# Patient Record
Sex: Female | Born: 1960 | Race: White | Hispanic: No | Marital: Single | Smoking: Never smoker
Health system: Southern US, Community
[De-identification: ages and names within clinical notes are randomized; demographics above are authoritative.]

## PROBLEM LIST (undated history)

## (undated) DIAGNOSIS — I4891 Unspecified atrial fibrillation: Secondary | ICD-10-CM

## (undated) DIAGNOSIS — F419 Anxiety disorder, unspecified: Secondary | ICD-10-CM

## (undated) HISTORY — DX: Unspecified atrial fibrillation: I48.91

## (undated) HISTORY — DX: Anxiety disorder, unspecified: F41.9

## (undated) HISTORY — PX: CHOLECYSTECTOMY: SHX55

## (undated) HISTORY — PX: ABDOMINAL HYSTERECTOMY: SHX81

## (undated) HISTORY — PX: TUBAL LIGATION: SHX77

## (undated) HISTORY — PX: FEMUR SURGERY: SHX943

---

## 2006-04-14 DIAGNOSIS — G43909 Migraine, unspecified, not intractable, without status migrainosus: Secondary | ICD-10-CM | POA: Insufficient documentation

## 2006-04-14 DIAGNOSIS — R002 Palpitations: Secondary | ICD-10-CM | POA: Insufficient documentation

## 2006-04-14 DIAGNOSIS — M5127 Other intervertebral disc displacement, lumbosacral region: Secondary | ICD-10-CM | POA: Insufficient documentation

## 2006-04-14 DIAGNOSIS — I491 Atrial premature depolarization: Secondary | ICD-10-CM | POA: Insufficient documentation

## 2006-04-14 DIAGNOSIS — K921 Melena: Secondary | ICD-10-CM | POA: Insufficient documentation

## 2008-11-14 DIAGNOSIS — F411 Generalized anxiety disorder: Secondary | ICD-10-CM | POA: Insufficient documentation

## 2008-11-14 DIAGNOSIS — L719 Rosacea, unspecified: Secondary | ICD-10-CM | POA: Insufficient documentation

## 2011-01-13 DIAGNOSIS — I480 Paroxysmal atrial fibrillation: Secondary | ICD-10-CM | POA: Insufficient documentation

## 2015-01-02 DIAGNOSIS — N3281 Overactive bladder: Secondary | ICD-10-CM | POA: Insufficient documentation

## 2015-02-21 DIAGNOSIS — R928 Other abnormal and inconclusive findings on diagnostic imaging of breast: Secondary | ICD-10-CM | POA: Insufficient documentation

## 2015-05-07 DIAGNOSIS — N62 Hypertrophy of breast: Secondary | ICD-10-CM | POA: Insufficient documentation

## 2017-07-22 DIAGNOSIS — R030 Elevated blood-pressure reading, without diagnosis of hypertension: Secondary | ICD-10-CM | POA: Insufficient documentation

## 2017-07-22 DIAGNOSIS — R000 Tachycardia, unspecified: Secondary | ICD-10-CM | POA: Insufficient documentation

## 2017-07-22 DIAGNOSIS — M2550 Pain in unspecified joint: Secondary | ICD-10-CM | POA: Insufficient documentation

## 2019-02-09 ENCOUNTER — Ambulatory Visit (INDEPENDENT_AMBULATORY_CARE_PROVIDER_SITE_OTHER): Payer: Federal, State, Local not specified - PPO | Admitting: Medical-Surgical

## 2019-02-09 ENCOUNTER — Other Ambulatory Visit: Payer: Self-pay

## 2019-02-09 ENCOUNTER — Encounter: Payer: Self-pay | Admitting: Medical-Surgical

## 2019-02-09 VITALS — BP 116/77 | HR 81 | Temp 98.1°F | Ht 66.5 in | Wt 183.6 lb

## 2019-02-09 DIAGNOSIS — Z23 Encounter for immunization: Secondary | ICD-10-CM

## 2019-02-09 DIAGNOSIS — Z7689 Persons encountering health services in other specified circumstances: Secondary | ICD-10-CM | POA: Insufficient documentation

## 2019-02-09 DIAGNOSIS — Z8679 Personal history of other diseases of the circulatory system: Secondary | ICD-10-CM | POA: Diagnosis not present

## 2019-02-09 DIAGNOSIS — F411 Generalized anxiety disorder: Secondary | ICD-10-CM

## 2019-02-09 DIAGNOSIS — Z0000 Encounter for general adult medical examination without abnormal findings: Secondary | ICD-10-CM

## 2019-02-09 MED ORDER — DULOXETINE HCL 60 MG PO CSDR: 1.0000 | DELAYED_RELEASE_CAPSULE | Freq: Every day | ORAL | 3 refills | Status: DC

## 2019-02-09 NOTE — Assessment & Plan Note (Signed)
Stable with regular rate and rhythm today.  No indication for medical intervention, will monitor.

## 2019-02-09 NOTE — Assessment & Plan Note (Signed)
Feels current regimen of Cymbalta is effective.  Refills provided.  Discussed options for adding counseling if desired.  She will let me know if she would like to do this.

## 2019-02-09 NOTE — Assessment & Plan Note (Signed)
Last annual physical exam January 2020.  Up-to-date on mammogram, colonoscopy, and Pap.  Flu shot today.  Will obtain records to evaluate hep C screening, HIV screening, and last Tdap.

## 2019-02-09 NOTE — Assessment & Plan Note (Signed)
Plan for annual physical exam in the next 6 to 8 weeks.  Orders for lab work entered today so she can have results available for review/discussion at appointment.

## 2019-02-09 NOTE — Progress Notes (Signed)
99New Patient Office Visit  Subjective:  Patient ID: Patricia Conner, female    DOB: [birth year]  Age: 59 y.o. MRN: [id number]  CC:  Chief Complaint  Patient presents with  . Establish Care    HPI Patricia Conner is a pleasant 59 year old female presenting today to establish care. Relocated from [place] almost a month ago. Retired from [employer].  Anxiety- Managed with daily Cymbalta 60mg . Does online classes and activities to help with anxiety management.   History of a fib- No current medical management or anticoagulation. Obtaining medical records from previous PCP. No irregular heart beats,  CP, palpitations, SOB, or lower extremity edema.   Concerns today: None  Past Medical History:  Diagnosis Date  . A-fib ([place])   . Anxiety     Past Surgical History:  Procedure Laterality Date  . ABDOMINAL HYSTERECTOMY    . CHOLECYSTECTOMY    . FEMUR SURGERY Left   . TUBAL LIGATION      Family History  Problem Relation Age of Onset  . Heart attack Father   . Colon cancer Father     Social History   Socioeconomic History  . Marital status: Divorced    Spouse name: Not on file  . Number of children: Not on file  . Years of education: Not on file  . Highest education level: Not on file  Occupational History  . Occupation: Retired  Tobacco Use  . Smoking status: Never Smoker  . Smokeless tobacco: Never Used  Substance and Sexual Activity  . Alcohol use: Yes    Comment: Occasionally  . Drug use: Never  . Sexual activity: Not Currently    Partners: Male    Birth control/protection: Surgical  Other Topics Concern  . Not on file  Social History Narrative  . Not on file   Social Determinants of Health   Financial Resource Strain:   . Difficulty of Paying Living Expenses: Not on file  Food Insecurity:   . Worried About Charity fundraiser in the Last Year: Not on file  . Ran Out of Food in the Last Year: Not on file  Transportation Needs:   . Lack of Transportation  (Medical): Not on file  . Lack of Transportation (Non-Medical): Not on file  Physical Activity:   . Days of Exercise per Week: Not on file  . Minutes of Exercise per Session: Not on file  Stress:   . Feeling of Stress : Not on file  Social Connections:   . Frequency of Communication with Friends and Family: Not on file  . Frequency of Social Gatherings with Friends and Family: Not on file  . Attends Religious Services: Not on file  . Active Member of Clubs or Organizations: Not on file  . Attends Archivist Meetings: Not on file  . Marital Status: Not on file  Intimate Partner Violence:   . Fear of Current or Ex-Partner: Not on file  . Emotionally Abused: Not on file  . Physically Abused: Not on file  . Sexually Abused: Not on file    ROS Review of Systems  Constitutional: Negative for chills, fatigue, fever and unexpected weight change.  HENT: Negative for congestion, rhinorrhea, sinus pressure and sore throat.   Respiratory: Negative for cough, chest tightness and shortness of breath.   Cardiovascular: Negative for chest pain, palpitations and leg swelling.  Gastrointestinal: Negative for abdominal pain, constipation, diarrhea, nausea and vomiting.  Endocrine: Negative for cold intolerance and heat intolerance.  Genitourinary: Positive for  urgency (urge incontinence if holding urine too long). Negative for dysuria, frequency, vaginal bleeding and vaginal discharge.  Skin: Negative for rash and wound.  Neurological: Negative for dizziness, light-headedness and headaches.  Hematological: Does not bruise/bleed easily.  Psychiatric/Behavioral: Negative for self-injury, sleep disturbance and suicidal ideas. The patient is nervous/anxious (managed by Cymbalta).    Depression screen [facility] 2/9 02/09/2019  Decreased Interest 0  Down, Depressed, Hopeless 0  PHQ - 2 Score 0  Altered sleeping 2  Tired, decreased energy 0  Change in appetite 0  Feeling bad or failure about  yourself  0  Trouble concentrating 0  Moving slowly or fidgety/restless 0  Suicidal thoughts 0  PHQ-9 Score 2  Difficult doing work/chores Not difficult at all   GAD 7 : Generalized Anxiety Score 02/09/2019  Nervous, Anxious, on Edge 2  Control/stop worrying 1  Worry too much - different things 1  Trouble relaxing 0  Restless 0  Easily annoyed or irritable 0  Afraid - awful might happen 0  Total GAD 7 Score 4  Anxiety Difficulty Not difficult at all     Objective:   Today's Vitals: BP 116/77   Pulse 81   Temp 98.1 F (36.7 C) (Oral)   Ht 5' 6.5" (1.689 m)   Wt 183 lb 9.6 oz (83.3 kg)   SpO2 96%   BMI 29.19 kg/m   Physical Exam Vitals reviewed.  Constitutional:      General: She is not in acute distress.    Appearance: Normal appearance.  HENT:     Head: Normocephalic and atraumatic.  Cardiovascular:     Rate and Rhythm: Normal rate and regular rhythm.     Pulses: Normal pulses.     Heart sounds: Normal heart sounds. No murmur. No friction rub. No gallop.   Pulmonary:     Effort: Pulmonary effort is normal. No respiratory distress.     Breath sounds: Normal breath sounds. No wheezing.  Skin:    General: Skin is warm and dry.  Neurological:     Mental Status: She is alert and oriented to person, place, and time.  Psychiatric:        Mood and Affect: Mood normal.        Behavior: Behavior normal.        Thought Content: Thought content normal.        Judgment: Judgment normal.     Assessment & Plan:   Problem List Items Addressed This Visit      Other   Generalized anxiety disorder    Feels current regimen of Cymbalta is effective.  Refills provided.  Discussed options for adding counseling if desired.  She will let me know if she would like to do this.      Relevant Medications   DULoxetine HCl 60 MG CSDR   History of atrial fibrillation    Stable with regular rate and rhythm today.  No indication for medical intervention, will monitor.       Encounter to establish care - Primary    Last annual physical exam January 2020.  Up-to-date on mammogram, colonoscopy, and Pap.  Flu shot today.  Will obtain records to evaluate hep C screening, HIV screening, and last Tdap.       Encounter for preventive care    Plan for annual physical exam in the next 6 to 8 weeks.  Orders for lab work entered today so she can have results available for review/discussion at appointment.  Relevant Orders   CBC   COMPLETE METABOLIC PANEL WITH GFR   Lipid panel   TSH      Outpatient Encounter Medications as of 02/09/2019  Medication Sig  . DULoxetine HCl 60 MG CSDR Take 1 tablet by mouth daily.  . [DISCONTINUED] DULoxetine HCl 60 MG CSDR Take 1 tablet by mouth daily.   No facility-administered encounter medications on file as of 02/09/2019.    Follow-up: Return in about 6 weeks (around 03/23/2019) for annual physical exam and lab review.   Christen Butter, NP

## 2019-03-01 ENCOUNTER — Telehealth (INDEPENDENT_AMBULATORY_CARE_PROVIDER_SITE_OTHER): Payer: Federal, State, Local not specified - PPO | Admitting: Medical-Surgical

## 2019-03-01 DIAGNOSIS — F411 Generalized anxiety disorder: Secondary | ICD-10-CM

## 2019-03-01 MED ORDER — DULOXETINE HCL 60 MG PO CPEP: 60.0000 mg | ORAL_CAPSULE | Freq: Every day | ORAL | 3 refills | Status: DC

## 2019-03-01 NOTE — Telephone Encounter (Signed)
Patient called and reports that her Duloxetine capsule sent as not a sprinkle capsule. She reports this is too expensive. Please advise.

## 2019-03-02 NOTE — Telephone Encounter (Signed)
Patient advised.

## 2019-04-13 DIAGNOSIS — F4323 Adjustment disorder with mixed anxiety and depressed mood: Secondary | ICD-10-CM | POA: Diagnosis not present

## 2019-04-18 DIAGNOSIS — F4323 Adjustment disorder with mixed anxiety and depressed mood: Secondary | ICD-10-CM | POA: Diagnosis not present

## 2019-04-26 DIAGNOSIS — F4323 Adjustment disorder with mixed anxiety and depressed mood: Secondary | ICD-10-CM | POA: Diagnosis not present

## 2019-07-03 DIAGNOSIS — Z0000 Encounter for general adult medical examination without abnormal findings: Secondary | ICD-10-CM | POA: Diagnosis not present

## 2019-07-03 NOTE — Patient Instructions (Signed)

## 2019-07-03 NOTE — Progress Notes (Signed)
HPI: Patricia Conner is a 59 y.o. female who  has a past medical history of A-fib ([place]) and Anxiety.  she presents to [facility] today, 07/04/19,  for chief complaint of: Annual physical exam  Preventative care: Dentist: Dorie Rank here from [place] in January so she has not set up a dentist appointment yet.  No dental concerns today and she is aware of the recommendation of every 6 month cleanings.  Eye care: Last eye exam approximately 4 years ago.  Notes that she does have some near vision changes as she is getting older.  Uses reading glasses.  Diet: Reports she has made several dietary changes and is now eating more fruits and vegetables as well as salads and lean meats.  Drinking water regularly.  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  She is also working in the barn and performing moderate to heavy physical activity several hours each day.  Even though it looks like she is gained a few pounds, she reports her clothes are fitting better.  She reports that she is still adjusting to [facility] weather in the heat has been very challenging lately.  She reports yesterday having an episode of increased heart rate, feeling out of breath as she was working outside in the barn.  She was able to finish her work but afterwards, she reports feeling completely exhausted.  She is try to stay well-hydrated in the heat.  Her symptoms resolved once she was able to rest and cool off.  Past medical, surgical, social and family history reviewed:  Patient Active Problem List   Diagnosis Date Noted  . Generalized anxiety disorder 02/09/2019  . History of atrial fibrillation 02/09/2019  . Encounter to establish care 02/09/2019  . Encounter for preventive care 02/09/2019    Past Surgical History:  Procedure Laterality Date  . ABDOMINAL HYSTERECTOMY    . CHOLECYSTECTOMY    . FEMUR SURGERY Left   . TUBAL LIGATION      Social  History   Tobacco Use  . Smoking status: Never Smoker  . Smokeless tobacco: Never Used  Substance Use Topics  . Alcohol use: Yes    Comment: Occasionally    Family History  Problem Relation Age of Onset  . Heart attack Father   . Colon cancer Father      Current medication list and allergy/intolerance information reviewed:    Current Outpatient Medications  Medication Sig Dispense Refill  . DULoxetine (CYMBALTA) 60 MG capsule Take 1 capsule (60 mg total) by mouth daily. 90 capsule 3   No current facility-administered medications for this visit.    No Known Allergies    Review of Systems:  Constitutional:  No  fever, no chills, No recent illness, No unintentional weight changes. No significant fatigue.   HEENT: Early morning headaches that resolve without treatment, near vision change, no hearing change, No sore throat, No  sinus pressure  Cardiac: No  chest pain, No  pressure, No palpitations, No  Orthopnea  Respiratory:  No  shortness of breath. No  Cough  Gastrointestinal: No  abdominal pain, No  nausea, No  vomiting,  No  blood in stool, No  diarrhea, No  constipation   Musculoskeletal: No new myalgia/arthralgia  Skin: No  Rash, skin lesion on right thigh that she is requesting a dermatology referral to have evaluated  Genitourinary: + urge/stress incontinence, No  abnormal genital bleeding, No abnormal genital discharge  Hem/Onc: No  easy bruising/bleeding, No  abnormal lymph node  Endocrine: No cold intolerance,  No heat intolerance. No polyuria/polydipsia/polyphagia   Neurologic: No  weakness, No  dizziness, No  slurred speech/focal weakness/facial droop  Psychiatric: No  concerns with depression, No  concerns with anxiety, No sleep problems, No mood problems  Depression screen [facility] 2/9 07/04/2019 02/09/2019  Decreased Interest 1 0  Down, Depressed, Hopeless 0 0  PHQ - 2 Score 1 0  Altered sleeping 0 2  Tired, decreased energy 1 0  Change in appetite 0 0   Feeling bad or failure about yourself  0 0  Trouble concentrating 0 0  Moving slowly or fidgety/restless 0 0  Suicidal thoughts 0 0  PHQ-9 Score 2 2  Difficult doing work/chores Not difficult at all Not difficult at all   GAD 7 : Generalized Anxiety Score 07/04/2019 02/09/2019  Nervous, Anxious, on Edge 0 2  Control/stop worrying 0 1  Worry too much - different things 0 1  Trouble relaxing 0 0  Restless 0 0  Easily annoyed or irritable 0 0  Afraid - awful might happen 0 0  Total GAD 7 Score 0 4  Anxiety Difficulty Not difficult at all Not difficult at all   Exam:  BP 109/73   Pulse 81   Temp 98.1 F (36.7 C) (Oral)   Ht 5' 6.5" (1.689 m)   Wt 188 lb 11.2 oz (85.6 kg)   SpO2 96%   BMI 30.00 kg/m   Constitutional: VS see above. General Appearance: alert, well-developed, well-nourished, NAD  Eyes: Normal lids and conjunctive, non-icteric sclera  Ears, Nose, Mouth, Throat: MMM, Normal external inspection ears/nares/mouth/lips/gums. TM normal bilaterally. Pharynx/tonsils no erythema, no exudate. Nasal mucosa normal.   Neck: No masses, trachea midline. No thyroid enlargement. No tenderness/mass appreciated. No lymphadenopathy  Respiratory: Normal respiratory effort. no wheeze, no rhonchi, no rales  Cardiovascular: S1/S2 normal, no murmur, no rub/gallop auscultated. RRR. No lower extremity edema. Pedal pulse II/IV bilaterally DP and PT. No carotid bruit or JVD. No abdominal aortic bruit.  Gastrointestinal: Nontender, no masses. No hepatomegaly, no splenomegaly. No hernia appreciated. Bowel sounds normal. Rectal exam deferred.   Musculoskeletal: Gait normal. No clubbing/cyanosis of digits.   Neurological: Normal balance/coordination. No tremor. No cranial nerve deficit on limited exam. Motor and sensation intact and symmetric. Cerebellar reflexes intact.   Skin: warm, dry, intact. No rash/ulcer. No concerning nevi or subq nodules on limited exam.    Psychiatric: Normal  judgment/insight. Normal mood and affect. Oriented x3.    Results for orders placed or performed in visit on 02/09/19 (from the past 72 hour(s))  CBC     Status: None   Collection Time: 07/03/19  9:05 AM  Result Value Ref Range   WBC 7.1 3.8 - 10.8 Thousand/uL   RBC 4.63 3.80 - 5.10 Million/uL   Hemoglobin 13.5 11.7 - 15.5 g/dL   HCT 41.8 35 - 45 %   MCV 90.3 80.0 - 100.0 fL   MCH 29.2 27.0 - 33.0 pg   MCHC 32.3 32.0 - 36.0 g/dL   RDW 13.1 11.0 - 15.0 %   Platelets 280 140 - 400 Thousand/uL   MPV 11.6 7.5 - 12.5 fL  COMPLETE METABOLIC PANEL WITH GFR     Status: Abnormal   Collection Time: 07/03/19  9:05 AM  Result Value Ref Range   Glucose, Bld 97 65 - 99 mg/dL    Comment: .  Fasting reference interval .    BUN 13 7 - 25 mg/dL   Creat 0.72 0.50 - 1.05 mg/dL    Comment: For patients >70 years of age, the reference limit for Creatinine is approximately 13% higher for people identified as African-American. .    GFR, Est Non African American 94 > OR = 60 mL/min/1.64m   GFR, Est African American 108 > OR = 60 mL/min/1.74m  BUN/Creatinine Ratio NOT APPLICABLE 6 - 22 (calc)   Sodium 140 135 - 146 mmol/L   Potassium 4.5 3.5 - 5.3 mmol/L   Chloride 107 98 - 110 mmol/L   CO2 22 20 - 32 mmol/L   Calcium 10.0 8.6 - 10.4 mg/dL   Total Protein 7.1 6.1 - 8.1 g/dL   Albumin 4.6 3.6 - 5.1 g/dL   Globulin 2.5 1.9 - 3.7 g/dL (calc)   AG Ratio 1.8 1.0 - 2.5 (calc)   Total Bilirubin 0.3 0.2 - 1.2 mg/dL   Alkaline phosphatase (APISO) 111 37 - 153 U/L   AST 29 10 - 35 U/L   ALT 33 (H) 6 - 29 U/L  Lipid panel     Status: Abnormal   Collection Time: 07/03/19  9:05 AM  Result Value Ref Range   Cholesterol 201 (H) <200 mg/dL   HDL 65 > OR = 50 mg/dL   Triglycerides 117 <150 mg/dL   LDL Cholesterol (Calc) 114 (H) mg/dL (calc)    Comment: Reference range: <100 . Desirable range <100 mg/dL for primary prevention;   <70 mg/dL for patients with CHD or diabetic patients  with >  or = 2 CHD risk factors. . Marland KitchenDL-C is now calculated using the Martin-Hopkins  calculation, which is a validated novel method providing  better accuracy than the Friedewald equation in the  estimation of LDL-C.  MaCresenciano Genret al. JAAnnamaria [id number];[id number]  (http://education.[employer].com/faq/FAQ164)    Total CHOL/HDL Ratio 3.1 <5.0 (calc)   Non-HDL Cholesterol (Calc) 136 (H) <130 mg/dL (calc)    Comment: For patients with diabetes plus 1 major ASCVD risk  factor, treating to a non-HDL-C goal of <100 mg/dL  (LDL-C of <70 mg/dL) is considered a therapeutic  option.     No results found.   ASSESSMENT/PLAN:   1. Annual physical exam Preventative labs drawn yesterday. Discussed results with patient.  2. Encounter for screening mammogram for malignant neoplasm of breast History of abnormal mammogram 3 years ago.  Due for recheck so ordering diagnostic mammogram today.- MM DIAG BREAST TOMO BILATERAL; Future  3. Skin lesion As she would like her skin lesion evaluated as well as a full skin survey, referring to dermatology and patient request. - Ambulatory referral to Dermatology  4. Need for hepatitis C screening test Discussed recommended screening with patient.  She is amenable to this we will add this for addition to future lab draw. - Hepatitis C antibody  5. Encounter for screening for HIV Discussed recommended screening with patient.  She is amenable to this as well so we will add this for future lab draw. - HIV Antibody (routine testing w rflx)   Orders Placed This Encounter  Procedures  . MM DIAG BREAST TOMO BILATERAL  . Hepatitis C antibody  . HIV Antibody (routine testing w rflx)  . Ambulatory referral to Dermatology    No orders of the defined types were placed in this encounter.   Patient Instructions  Preventive Care [id number] Old, Female Preventive care refers to visits with your health care provider  and lifestyle choices that can promote health  and wellness. This includes:  A yearly physical exam. This may also be called an annual well check.  Regular dental visits and eye exams.  Immunizations.  Screening for certain conditions.  Healthy lifestyle choices, such as eating a healthy diet, getting regular exercise, not using drugs or products that contain nicotine and tobacco, and limiting alcohol use. What can I expect for my preventive care visit? Physical exam Your health care provider will check your:  Height and weight. This may be used to calculate body mass index (BMI), which tells if you are at a healthy weight.  Heart rate and blood pressure.  Skin for abnormal spots. Counseling Your health care provider may ask you questions about your:  Alcohol, tobacco, and drug use.  Emotional well-being.  Home and relationship well-being.  Sexual activity.  Eating habits.  Work and work Statistician.  Method of birth control.  Menstrual cycle.  Pregnancy history. What immunizations do I need?  Influenza (flu) vaccine  This is recommended every year. Tetanus, diphtheria, and pertussis (Tdap) vaccine  You may need a Td booster every 10 years. Varicella (chickenpox) vaccine  You may need this if you have not been vaccinated. Zoster (shingles) vaccine  You may need this after age 57. Measles, mumps, and rubella (MMR) vaccine  You may need at least one dose of MMR if you were born in 1957 or later. You may also need a second dose. Pneumococcal conjugate (PCV13) vaccine  You may need this if you have certain conditions and were not previously vaccinated. Pneumococcal polysaccharide (PPSV23) vaccine  You may need one or two doses if you smoke cigarettes or if you have certain conditions. Meningococcal conjugate (MenACWY) vaccine  You may need this if you have certain conditions. Hepatitis A vaccine  You may need this if you have certain conditions or if you travel or work in places where you may be  exposed to hepatitis A. Hepatitis B vaccine  You may need this if you have certain conditions or if you travel or work in places where you may be exposed to hepatitis B. Haemophilus influenzae type b (Hib) vaccine  You may need this if you have certain conditions. Human papillomavirus (HPV) vaccine  If recommended by your health care provider, you may need three doses over 6 months. You may receive vaccines as individual doses or as more than one vaccine together in one shot (combination vaccines). Talk with your health care provider about the risks and benefits of combination vaccines. What tests do I need? Blood tests  Lipid and cholesterol levels. These may be checked every 5 years, or more frequently if you are over 58 years old.  Hepatitis C test.  Hepatitis B test. Screening  Lung cancer screening. You may have this screening every year starting at age 66 if you have a 30-pack-year history of smoking and currently smoke or have quit within the past 15 years.  Colorectal cancer screening. All adults should have this screening starting at age 54 and continuing until age 85. Your health care provider may recommend screening at age 24 if you are at increased risk. You will have tests every 1-10 years, depending on your results and the type of screening test.  Diabetes screening. This is done by checking your blood sugar (glucose) after you have not eaten for a while (fasting). You may have this done every 1-3 years.  Mammogram. This may be done every 1-2 years. Talk with  your health care provider about when you should start having regular mammograms. This may depend on whether you have a family history of breast cancer.  BRCA-related cancer screening. This may be done if you have a family history of breast, ovarian, tubal, or peritoneal cancers.  Pelvic exam and Pap test. This may be done every 3 years starting at age 59. Starting at age 56, this may be done every 5 years if you have  a Pap test in combination with an HPV test. Other tests  Sexually transmitted disease (STD) testing.  Bone density scan. This is done to screen for osteoporosis. You may have this scan if you are at high risk for osteoporosis. Follow these instructions at home: Eating and drinking  Eat a diet that includes fresh fruits and vegetables, whole grains, lean protein, and low-fat dairy.  Take vitamin and mineral supplements as recommended by your health care provider.  Do not drink alcohol if: ? Your health care provider tells you not to drink. ? You are pregnant, may be pregnant, or are planning to become pregnant.  If you drink alcohol: ? Limit how much you have to 0-1 drink a day. ? Be aware of how much alcohol is in your drink. In the [place] one drink equals one 12 oz bottle of beer (355 mL), one 5 oz glass of wine (148 mL), or one 1 oz glass of hard liquor (44 mL). Lifestyle  Take daily care of your teeth and gums.  Stay active. Exercise for at least 30 minutes on 5 or more days each week.  Do not use any products that contain nicotine or tobacco, such as cigarettes, e-cigarettes, and chewing tobacco. If you need help quitting, ask your health care provider.  If you are sexually active, practice safe sex. Use a condom or other form of birth control (contraception) in order to prevent pregnancy and STIs (sexually transmitted infections).  If told by your health care provider, take low-dose aspirin daily starting at age 57. What's next?  Visit your health care provider once a year for a well check visit.  Ask your health care provider how often you should have your eyes and teeth checked.  Stay up to date on all vaccines. This information is not intended to replace advice given to you by your health care provider. Make sure you discuss any questions you have with your health care provider. Document Revised: 09/09/2017 Document Reviewed: 09/09/2017 Elsevier Patient Education   [place].   Follow-up plan: Return in about 1 year (around 07/03/2020) for annual physical exam or sooner if needed.  Clearnce Sorrel, DNP, APRN, FNP-BC [facility]

## 2019-07-04 ENCOUNTER — Encounter: Payer: Self-pay | Admitting: Medical-Surgical

## 2019-07-04 ENCOUNTER — Ambulatory Visit (INDEPENDENT_AMBULATORY_CARE_PROVIDER_SITE_OTHER): Payer: Federal, State, Local not specified - PPO | Admitting: Medical-Surgical

## 2019-07-04 VITALS — BP 109/73 | HR 81 | Temp 98.1°F | Ht 66.5 in | Wt 188.7 lb

## 2019-07-04 DIAGNOSIS — Z0000 Encounter for general adult medical examination without abnormal findings: Secondary | ICD-10-CM | POA: Diagnosis not present

## 2019-07-04 DIAGNOSIS — Z1159 Encounter for screening for other viral diseases: Secondary | ICD-10-CM | POA: Diagnosis not present

## 2019-07-04 DIAGNOSIS — L989 Disorder of the skin and subcutaneous tissue, unspecified: Secondary | ICD-10-CM | POA: Diagnosis not present

## 2019-07-04 DIAGNOSIS — Z1231 Encounter for screening mammogram for malignant neoplasm of breast: Secondary | ICD-10-CM | POA: Diagnosis not present

## 2019-07-04 DIAGNOSIS — Z114 Encounter for screening for human immunodeficiency virus [HIV]: Secondary | ICD-10-CM

## 2019-07-04 LAB — COMPLETE METABOLIC PANEL WITH GFR
AG Ratio: 1.8 (calc) (ref 1.0–2.5)
ALT: 33 U/L — ABNORMAL HIGH (ref 6–29)
AST: 29 U/L (ref 10–35)
Albumin: 4.6 g/dL (ref 3.6–5.1)
Alkaline phosphatase (APISO): 111 U/L (ref 37–153)
BUN: 13 mg/dL (ref 7–25)
CO2: 22 mmol/L (ref 20–32)
Calcium: 10 mg/dL (ref 8.6–10.4)
Chloride: 107 mmol/L (ref 98–110)
Creat: 0.72 mg/dL (ref 0.50–1.05)
GFR, Est African American: 108 mL/min/{1.73_m2} (ref >=60)
GFR, Est Non African American: 94 mL/min/{1.73_m2} (ref >=60)
Globulin: 2.5 g/dL (calc) (ref 1.9–3.7)
Glucose, Bld: 97 mg/dL (ref 65–99)
Potassium: 4.5 mmol/L (ref 3.5–5.3)
Sodium: 140 mmol/L (ref 135–146)
Total Bilirubin: 0.3 mg/dL (ref 0.2–1.2)
Total Protein: 7.1 g/dL (ref 6.1–8.1)

## 2019-07-04 LAB — LIPID PANEL
Cholesterol: 201 mg/dL — ABNORMAL HIGH (ref <200)
HDL: 65 mg/dL (ref >=50)
LDL Cholesterol (Calc): 114 mg/dL (calc) — ABNORMAL HIGH
Non-HDL Cholesterol (Calc): 136 mg/dL (calc) — ABNORMAL HIGH (ref <130)
Total CHOL/HDL Ratio: 3.1 (calc) (ref <5.0)
Triglycerides: 117 mg/dL (ref <150)

## 2019-07-04 LAB — CBC
HCT: 41.8 % (ref 35.0–45.0)
Hemoglobin: 13.5 g/dL (ref 11.7–15.5)
MCH: 29.2 pg (ref 27.0–33.0)
MCHC: 32.3 g/dL (ref 32.0–36.0)
MCV: 90.3 fL (ref 80.0–100.0)
MPV: 11.6 fL (ref 7.5–12.5)
Platelets: 280 10*3/uL (ref 140–400)
RBC: 4.63 10*6/uL (ref 3.80–5.10)
RDW: 13.1 % (ref 11.0–15.0)
WBC: 7.1 10*3/uL (ref 3.8–10.8)

## 2019-07-17 ENCOUNTER — Telehealth: Payer: Self-pay | Admitting: Physician Assistant

## 2019-07-17 NOTE — Telephone Encounter (Signed)
Patient is calling for referral appointment for lesion that bleeds sometimes.  Appointment scheduled for 10/25/2019 at 10:00 AM with Shelly Flatten, PA-C

## 2019-07-17 NOTE — Telephone Encounter (Signed)
See message.

## 2019-07-20 DIAGNOSIS — F4323 Adjustment disorder with mixed anxiety and depressed mood: Secondary | ICD-10-CM | POA: Diagnosis not present

## 2019-09-11 ENCOUNTER — Encounter: Payer: Self-pay | Admitting: Medical-Surgical

## 2019-09-22 ENCOUNTER — Ambulatory Visit (INDEPENDENT_AMBULATORY_CARE_PROVIDER_SITE_OTHER): Payer: Federal, State, Local not specified - PPO

## 2019-09-22 ENCOUNTER — Ambulatory Visit (INDEPENDENT_AMBULATORY_CARE_PROVIDER_SITE_OTHER): Payer: Federal, State, Local not specified - PPO | Admitting: Medical-Surgical

## 2019-09-22 ENCOUNTER — Encounter: Payer: Self-pay | Admitting: Medical-Surgical

## 2019-09-22 ENCOUNTER — Other Ambulatory Visit: Payer: Self-pay

## 2019-09-22 VITALS — BP 102/68 | HR 81 | Temp 98.1°F | Ht 66.5 in | Wt 189.2 lb

## 2019-09-22 DIAGNOSIS — R768 Other specified abnormal immunological findings in serum: Secondary | ICD-10-CM

## 2019-09-22 DIAGNOSIS — M19072 Primary osteoarthritis, left ankle and foot: Secondary | ICD-10-CM | POA: Diagnosis not present

## 2019-09-22 DIAGNOSIS — M19042 Primary osteoarthritis, left hand: Secondary | ICD-10-CM | POA: Diagnosis not present

## 2019-09-22 DIAGNOSIS — M79672 Pain in left foot: Secondary | ICD-10-CM

## 2019-09-22 DIAGNOSIS — M79641 Pain in right hand: Secondary | ICD-10-CM

## 2019-09-22 DIAGNOSIS — M2550 Pain in unspecified joint: Secondary | ICD-10-CM

## 2019-09-22 DIAGNOSIS — Z23 Encounter for immunization: Secondary | ICD-10-CM

## 2019-09-22 DIAGNOSIS — M79671 Pain in right foot: Secondary | ICD-10-CM | POA: Diagnosis not present

## 2019-09-22 DIAGNOSIS — R200 Anesthesia of skin: Secondary | ICD-10-CM | POA: Diagnosis not present

## 2019-09-22 DIAGNOSIS — M79642 Pain in left hand: Secondary | ICD-10-CM | POA: Diagnosis not present

## 2019-09-22 DIAGNOSIS — R32 Unspecified urinary incontinence: Secondary | ICD-10-CM | POA: Diagnosis not present

## 2019-09-22 DIAGNOSIS — M65331 Trigger finger, right middle finger: Secondary | ICD-10-CM

## 2019-09-22 DIAGNOSIS — M19041 Primary osteoarthritis, right hand: Secondary | ICD-10-CM | POA: Diagnosis not present

## 2019-09-22 DIAGNOSIS — Z981 Arthrodesis status: Secondary | ICD-10-CM | POA: Diagnosis not present

## 2019-09-22 DIAGNOSIS — R7689 Other specified abnormal immunological findings in serum: Secondary | ICD-10-CM

## 2019-09-22 MED ORDER — CYCLOBENZAPRINE HCL 10 MG PO TABS
10.0000 mg | ORAL_TABLET | Freq: Three times a day (TID) | ORAL | 0 refills | Status: DC | PRN
Start: 2019-09-22 — End: 2019-10-26

## 2019-09-22 MED ORDER — IBUPROFEN 800 MG PO TABS: 800.0000 mg | ORAL_TABLET | Freq: Three times a day (TID) | ORAL | 2 refills | Status: DC | PRN

## 2019-09-22 NOTE — Progress Notes (Signed)
Subjective:    CC: joint pain  HPI: Pleasant 58 year old female presenting for evaluation of joint pain that has been present for at least 1 month. She has been having intermittent pains in her bilateral hands, wrists, and ankles that is described as sharp/stabbing. The pain in her ankles has begun to extend into her knees and even up to her groin at times. She notes that the pain is worse after sitting for a while. Has difficulty walking when pain is severe. Now pain is interfering with her sleeping, waking her at night and at times accompanied by muscle spasms. She has been feeling increasingly fatigued over the past months. Notes that she feels tingling in her toes, wrists, and fingers at times. Has been taking Tylenol when the pain is really bad but this hasn't helped much. Feels like her fingers have started to point out a bit. Grandfather had RA "severe enough that he committed suicide". Right middle finger has also been bothering her and often feels like she can't bend it at the PIP joint. If she is able to bend it, sometimes it will then not straighten back out until she massages the palmar aspect of it for a few minutes. Denies fever, chills, chest pain, shortness of breath, night sweats, and unexplained weight loss.   Has noted a worsening of her urinary incontinence lately. Previously experiencing stress incontinence regularly. Has been doing Kegel exercises as instructed but has not noticed any improvement. In the last few weeks has noticed leaking urine without stress or urge cues. She is concerned about this and interested in treatment options. Does not have an OB/GYN and would like a referral.   I reviewed the past medical history, family history, social history, surgical history, and allergies today and no changes were needed.  Please see the problem list section below in epic for further details.  Past Medical History: Past Medical History:  Diagnosis Date  . A-fib (HCC)   . Anxiety     Past Surgical History: Past Surgical History:  Procedure Laterality Date  . ABDOMINAL HYSTERECTOMY    . CHOLECYSTECTOMY    . FEMUR SURGERY Left   . TUBAL LIGATION     Social History: Social History   Socioeconomic History  . Marital status: Divorced    Spouse name: Not on file  . Number of children: Not on file  . Years of education: Not on file  . Highest education level: Not on file  Occupational History  . Occupation: Retired  Tobacco Use  . Smoking status: Never Smoker  . Smokeless tobacco: Never Used  Vaping Use  . Vaping Use: Never used  Substance and Sexual Activity  . Alcohol use: Yes    Comment: Occasionally  . Drug use: Never  . Sexual activity: Not Currently    Partners: Male    Birth control/protection: Surgical  Other Topics Concern  . Not on file  Social History Narrative  . Not on file   Social Determinants of Health   Financial Resource Strain:   . Difficulty of Paying Living Expenses: Not on file  Food Insecurity:   . Worried About Programme researcher, broadcasting/film/video in the Last Year: Not on file  . Ran Out of Food in the Last Year: Not on file  Transportation Needs:   . Lack of Transportation (Medical): Not on file  . Lack of Transportation (Non-Medical): Not on file  Physical Activity:   . Days of Exercise per Week: Not on file  . Minutes  of Exercise per Session: Not on file  Stress:   . Feeling of Stress : Not on file  Social Connections:   . Frequency of Communication with Friends and Family: Not on file  . Frequency of Social Gatherings with Friends and Family: Not on file  . Attends Religious Services: Not on file  . Active Member of Clubs or Organizations: Not on file  . Attends Banker Meetings: Not on file  . Marital Status: Not on file   Family History: Family History  Problem Relation Age of Onset  . Heart attack Father   . Colon cancer Father    Allergies: No Known Allergies Medications: See med rec.  Review of  Systems: See HPI for pertinent positives and negatives.   Objective:    General: Well Developed, well nourished, and in no acute distress.  Neuro: Alert and oriented x3. Sensation grossly intact.  HEENT: Normocephalic, atraumatic.  Skin: Warm and dry. Cardiac: Regular rate and rhythm, no murmurs rubs or gallops, no lower extremity edema.  Respiratory: Clear to auscultation bilaterally. Not using accessory muscles, speaking in full sentences. MSK: No tenderness to palpation of bilateral wrist joints and bilateral ankles. Tenderness to proximal portion of the right middle finger. ROM to bilateral hands and wrists intact except for right middle finger which has limited flexion. No joint swelling or deformities. Possible mild ulnar drift of fingers bilaterally with right hand worse than left. No Herberden's or Bouchard's nodes.   Impression and Recommendations:    1. Arthralgia, unspecified joint With a family history and the nature of her symptoms, evaluating for RA. Checking labs as below. Getting x-rays of bilateral hands/wrists and bilateral feet/ankles. May benefit from adding an anti-inflammtory for a brief time to see if this gives her some relief. Sending in Ibuprofen 800mg  every 8 hours as needed. With the muscle spasms in the middle of the night, sending in Flexeril as needed.  - Sedimentation rate - Rheumatoid factor - High sensitivity CRP - CK - BASIC METABOLIC PANEL WITH GFR - CBC w/Diff/Platelet - DG Hand Complete Left; Future - DG Hand Complete Right; Future - DG Foot Complete Left; Future - DG Foot Complete Right; Future - ANA  2. Urinary incontinence in female Referring to OB/GYN. - Ambulatory referral to Obstetrics / Gynecology  3. Trigger middle finger of right hand Discussed trigger finger and options to treat. Recommend following up with Dr. for further evaluation and treatment.   4. Need for influenza vaccination Flu vaccine given in office.  - Flu Vaccine  QUAD 36+ mos IM  5. Need for Tdap vaccination Tdap given in office.  - Tdap vaccine greater than or equal to 7yo IM  Return if symptoms worsen or fail to improve. ___________________________________________ Karie Schwalbe, DNP, APRN, FNP-BC Primary Care and Sports Medicine [facility] [place]

## 2019-09-22 NOTE — Patient Instructions (Signed)
Influenza (Flu) Vaccine (Inactivated or Recombinant): What You Need to Know 1. Why get vaccinated? Influenza vaccine can prevent influenza (flu). Flu is a contagious disease that spreads around the [place] every year, usually between October and May. Anyone can get the flu, but it is more dangerous for some people. Infants and young children, people 57 years of age and older, pregnant women, and people with certain health conditions or a weakened immune system are at greatest risk of flu complications. Pneumonia, bronchitis, sinus infections and ear infections are examples of flu-related complications. If you have a medical condition, such as heart disease, cancer or diabetes, flu can make it worse. Flu can cause fever and chills, sore throat, muscle aches, fatigue, cough, headache, and runny or stuffy nose. Some people may have vomiting and diarrhea, though this is more common in children than adults. Each year thousands of people in the [place] die from flu, and many more are hospitalized. Flu vaccine prevents millions of illnesses and flu-related visits to the doctor each year. 2. Influenza vaccine CDC recommends everyone 6 months of age and older get vaccinated every flu season. Children 6 months through 8 years of age may need 2 doses during a single flu season. Everyone else needs only 1 dose each flu season. It takes about 2 weeks for protection to develop after vaccination. There are many flu viruses, and they are always changing. Each year a new flu vaccine is made to protect against three or four viruses that are likely to cause disease in the upcoming flu season. Even when the vaccine doesn't exactly match these viruses, it may still provide some protection. Influenza vaccine does not cause flu. Influenza vaccine may be given at the same time as other vaccines. 3. Talk with your health care provider Tell your vaccine provider if the person getting the vaccine:  Has had an  allergic reaction after a previous dose of influenza vaccine, or has any severe, life-threatening allergies.  Has ever had Guillain-Barr Syndrome (also called GBS). In some cases, your health care provider may decide to postpone influenza vaccination to a future visit. People with minor illnesses, such as a cold, may be vaccinated. People who are moderately or severely ill should usually wait until they recover before getting influenza vaccine. Your health care provider can give you more information. 4. Risks of a vaccine reaction  Soreness, redness, and swelling where shot is given, fever, muscle aches, and headache can happen after influenza vaccine.  There may be a very small increased risk of Guillain-Barr Syndrome (GBS) after inactivated influenza vaccine (the flu shot). Young children who get the flu shot along with pneumococcal vaccine (PCV13), and/or DTaP vaccine at the same time might be slightly more likely to have a seizure caused by fever. Tell your health care provider if a child who is getting flu vaccine has ever had a seizure. People sometimes faint after medical procedures, including vaccination. Tell your provider if you feel dizzy or have vision changes or ringing in the ears. As with any medicine, there is a very remote chance of a vaccine causing a severe allergic reaction, other serious injury, or death. 5. What if there is a serious problem? An allergic reaction could occur after the vaccinated person leaves the clinic. If you see signs of a severe allergic reaction (hives, swelling of the face and throat, difficulty breathing, a fast heartbeat, dizziness, or weakness), call 9-1-1 and get the person to the nearest hospital. For other signs that   concern you, call your health care provider. Adverse reactions should be reported to the Vaccine Adverse Event Reporting System (VAERS). Your health care provider will usually file this report, or you can do it yourself. Visit the  VAERS website at www.vaers.hhs.gov or call 1-800-822-7967.VAERS is only for reporting reactions, and VAERS staff do not give medical advice. 6. The National Vaccine Injury Compensation Program The National Vaccine Injury Compensation Program (VICP) is a federal program that was created to compensate people who may have been injured by certain vaccines. Visit the VICP website at www.hrsa.gov/vaccinecompensation or call 1-800-338-2382 to learn about the program and about filing a claim. There is a time limit to file a claim for compensation. 7. How can I learn more?  Ask your healthcare provider.  Call your local or state health department.  Contact the Centers for Disease Control and Prevention (CDC): ? Call 1-800-232-4636 (1-800-CDC-INFO) or ? Visit CDC's www.cdc.gov/flu Vaccine Information Statement (Interim) Inactivated Influenza Vaccine (08/26/2017) This information is not intended to replace advice given to you by your health care provider. Make sure you discuss any questions you have with your health care provider. Document Revised: 04/19/2018 Document Reviewed: 08/30/2017 Elsevier Patient Education  2020 Elsevier Inc.  https://www.cdc.gov/vaccines/hcp/vis/vis-statements/tdap.pdf">  Tdap (Tetanus, Diphtheria, Pertussis) Vaccine: What You Need to Know 1. Why get vaccinated? Tdap vaccine can prevent tetanus, diphtheria, and pertussis. Diphtheria and pertussis spread from person to person. Tetanus enters the body through cuts or wounds.  TETANUS (T) causes painful stiffening of the muscles. Tetanus can lead to serious health problems, including being unable to open the mouth, having trouble swallowing and breathing, or death.  DIPHTHERIA (D) can lead to difficulty breathing, heart failure, paralysis, or death.  PERTUSSIS (aP), also known as "whooping cough," can cause uncontrollable, violent coughing which makes it hard to breathe, eat, or drink. Pertussis can be extremely serious in  babies and young children, causing pneumonia, convulsions, brain damage, or death. In teens and adults, it can cause weight loss, loss of bladder control, passing out, and rib fractures from severe coughing. 2. Tdap vaccine Tdap is only for children 7 years and older, adolescents, and adults.  Adolescents should receive a single dose of Tdap, preferably at age 11 or 12 years. Pregnant women should get a dose of Tdap during every pregnancy, to protect the newborn from pertussis. Infants are most at risk for severe, life-threatening complications from pertussis. Adults who have never received Tdap should get a dose of Tdap. Also, adults should receive a booster dose every 10 years, or earlier in the case of a severe and dirty wound or burn. Booster doses can be either Tdap or Td (a different vaccine that protects against tetanus and diphtheria but not pertussis). Tdap may be given at the same time as other vaccines. 3. Talk with your health care provider Tell your vaccine provider if the person getting the vaccine:  Has had an allergic reaction after a previous dose of any vaccine that protects against tetanus, diphtheria, or pertussis, or has any severe, life-threatening allergies.  Has had a coma, decreased level of consciousness, or prolonged seizures within 7 days after a previous dose of any pertussis vaccine (DTP, DTaP, or Tdap).  Has seizures or another nervous system problem.  Has ever had Guillain-Barr Syndrome (also called GBS).  Has had severe pain or swelling after a previous dose of any vaccine that protects against tetanus or diphtheria. In some cases, your health care provider may decide to postpone Tdap vaccination to a   future visit.  People with minor illnesses, such as a cold, may be vaccinated. People who are moderately or severely ill should usually wait until they recover before getting Tdap vaccine.  Your health care provider can give you more information. 4. Risks of a  vaccine reaction  Pain, redness, or swelling where the shot was given, mild fever, headache, feeling tired, and nausea, vomiting, diarrhea, or stomachache sometimes happen after Tdap vaccine. People sometimes faint after medical procedures, including vaccination. Tell your provider if you feel dizzy or have vision changes or ringing in the ears.  As with any medicine, there is a very remote chance of a vaccine causing a severe allergic reaction, other serious injury, or death. 5. What if there is a serious problem? An allergic reaction could occur after the vaccinated person leaves the clinic. If you see signs of a severe allergic reaction (hives, swelling of the face and throat, difficulty breathing, a fast heartbeat, dizziness, or weakness), call 9-1-1 and get the person to the nearest hospital. For other signs that concern you, call your health care provider.  Adverse reactions should be reported to the Vaccine Adverse Event Reporting System (VAERS). Your health care provider will usually file this report, or you can do it yourself. Visit the VAERS website at www.vaers.hhs.gov or call 1-800-822-7967. VAERS is only for reporting reactions, and VAERS staff do not give medical advice. 6. The National Vaccine Injury Compensation Program The National Vaccine Injury Compensation Program (VICP) is a federal program that was created to compensate people who may have been injured by certain vaccines. Visit the VICP website at www.hrsa.gov/vaccinecompensation or call 1-800-338-2382 to learn about the program and about filing a claim. There is a time limit to file a claim for compensation. 7. How can I learn more?  Ask your health care provider.  Call your local or state health department.  Contact the Centers for Disease Control and Prevention (CDC): ? Call 1-800-232-4636 (1-800-CDC-INFO) or ? Visit CDC's website at www.cdc.gov/vaccines Vaccine Information Statement Tdap (Tetanus, Diphtheria, Pertussis)  Vaccine (04/13/2018) This information is not intended to replace advice given to you by your health care provider. Make sure you discuss any questions you have with your health care provider. Document Revised: 04/22/2018 Document Reviewed: 04/25/2018 Elsevier Patient Education  2020 Elsevier Inc.   

## 2019-09-25 LAB — CBC WITH DIFFERENTIAL/PLATELET
Absolute Monocytes: 580 cells/uL (ref 200–950)
Basophils Absolute: 28 cells/uL (ref 0–200)
Basophils Relative: 0.4 %
Eosinophils Absolute: 131 cells/uL (ref 15–500)
Eosinophils Relative: 1.9 %
HCT: 40.8 % (ref 35.0–45.0)
Hemoglobin: 13.3 g/dL (ref 11.7–15.5)
Lymphs Abs: 2277 cells/uL (ref 850–3900)
MCH: 28.9 pg (ref 27.0–33.0)
MCHC: 32.6 g/dL (ref 32.0–36.0)
MCV: 88.7 fL (ref 80.0–100.0)
MPV: 11.9 fL (ref 7.5–12.5)
Monocytes Relative: 8.4 %
Neutro Abs: 3885 cells/uL (ref 1500–7800)
Neutrophils Relative %: 56.3 %
Platelets: 271 10*3/uL (ref 140–400)
RBC: 4.6 10*6/uL (ref 3.80–5.10)
RDW: 13.3 % (ref 11.0–15.0)
Total Lymphocyte: 33 %
WBC: 6.9 10*3/uL (ref 3.8–10.8)

## 2019-09-25 LAB — BASIC METABOLIC PANEL WITH GFR
BUN: 9 mg/dL (ref 7–25)
CO2: 28 mmol/L (ref 20–32)
Calcium: 9.7 mg/dL (ref 8.6–10.4)
Chloride: 105 mmol/L (ref 98–110)
Creat: 0.74 mg/dL (ref 0.50–1.05)
GFR, Est African American: 105 mL/min/{1.73_m2} (ref >=60)
GFR, Est Non African American: 91 mL/min/{1.73_m2} (ref >=60)
Glucose, Bld: 120 mg/dL (ref 65–139)
Potassium: 4 mmol/L (ref 3.5–5.3)
Sodium: 139 mmol/L (ref 135–146)

## 2019-09-25 LAB — CK: Total CK: 98 U/L (ref 29–143)

## 2019-09-25 LAB — HIGH SENSITIVITY CRP: hs-CRP: 7.8 mg/L — ABNORMAL HIGH

## 2019-09-25 LAB — RHEUMATOID FACTOR: Rheumatoid fact SerPl-aCnc: <14 IU/mL (ref <14)

## 2019-09-25 LAB — SEDIMENTATION RATE: Sed Rate: 19 mm/h (ref 0–30)

## 2019-09-25 LAB — ENDOMYSIAL AB IGA RFLX TITER: Endomysial Ab IgA: NEGATIVE

## 2019-09-28 LAB — ANTI-NUCLEAR AB-TITER (ANA TITER): ANA Titer 1: 1:80 {titer} — ABNORMAL HIGH

## 2019-09-28 LAB — ANA: Anti Nuclear Antibody (ANA): POSITIVE — AB

## 2019-09-28 NOTE — Addendum Note (Signed)
Addended byChristen Butter on: 09/28/2019 12:40 PM   Modules accepted: Orders

## 2019-10-05 ENCOUNTER — Other Ambulatory Visit: Payer: Self-pay

## 2019-10-05 ENCOUNTER — Ambulatory Visit: Payer: Federal, State, Local not specified - PPO | Admitting: Obstetrics & Gynecology

## 2019-10-05 ENCOUNTER — Encounter: Payer: Self-pay | Admitting: Obstetrics & Gynecology

## 2019-10-05 VITALS — BP 128/75 | Ht 66.0 in | Wt 191.0 lb

## 2019-10-05 DIAGNOSIS — R32 Unspecified urinary incontinence: Secondary | ICD-10-CM

## 2019-10-05 MED ORDER — TOLTERODINE TARTRATE ER 2 MG PO CP24: 2.0000 mg | ORAL_CAPSULE | Freq: Every day | ORAL | 1 refills | Status: DC

## 2019-10-05 NOTE — Progress Notes (Signed)
   GYNECOLOGY OFFICE VISIT NOTE  History:   Patricia Conner is a 59 y.o. [phone] here today for evaluation and management of urinary incontinence.  History of of hysterectomy many years ago.  Had some stress incontinence in the past, but noticed in the 2-3 months, that her incontinence has worsened. Has been doing Kegel exercises as instructed but has not noticed any improvement. In the last few weeks, has noticed leaking urine without stress or urge cues, "it just happens".  Had negative infection analysis and autoimmune analysis by her PCP. She denies any abnormal vaginal discharge, bleeding, pelvic pain or other concerns.    Past Medical History:  Diagnosis Date  . A-fib (HCC)   . Anxiety     Past Surgical History:  Procedure Laterality Date  . ABDOMINAL HYSTERECTOMY    . CHOLECYSTECTOMY    . FEMUR SURGERY Left   . TUBAL LIGATION      The following portions of the patient's history were reviewed and updated as appropriate: allergies, current medications, past family history, past medical history, past social history, past surgical history and problem list.   Review of Systems:  Pertinent items noted in HPI and remainder of comprehensive ROS otherwise negative.  Physical Exam:  BP 128/75   Ht 5\' 6"  (1.676 m)   Wt 191 lb (86.6 kg)   BMI 30.83 kg/m  CONSTITUTIONAL: Well-developed, well-nourished female in no acute distress.  HEENT:  Normocephalic, atraumatic. External right and left ear normal. No scleral icterus.  NECK: Normal range of motion, supple, no masses noted on observation SKIN: No rash noted. Not diaphoretic. No erythema. No pallor. MUSCULOSKELETAL: Normal range of motion. No edema noted. NEUROLOGIC: Alert and oriented to person, place, and time. Normal muscle tone coordination. No cranial nerve deficit noted. PSYCHIATRIC: Normal mood and affect. Normal behavior. Normal judgment and thought content. CARDIOVASCULAR: Normal heart rate noted RESPIRATORY: Effort and  breath sounds normal, no problems with respiration noted ABDOMEN: No masses noted. No other overt distention noted.   PELVIC: Normal appearing external genitalia; normal urethral meatus; normal appearing vaginal mucosa. Mild/Grade 1 cystocele noted with Valsalva.   No abnormal discharge noted.  Performed in the presence of a chaperone     Assessment and Plan:    1. Incontinence in female Worried about overactive bladder or mixed incontinence.  Will do trial of Detrol, and refer to Urology for further evaluation and management.  - tolterodine (DETROL LA) 2 MG 24 hr capsule; Take 1 capsule (2 mg total) by mouth daily.  Dispense: 30 capsule; Refill: 1 - Ambulatory referral to Urology Routine preventative health maintenance measures emphasized. Please refer to After Visit Summary for other counseling recommendations.   Return for any gynecologic concerns.    Total face-to-face time with patient: 20 minutes.  Over 50% of encounter was spent on counseling and coordination of care.   , MD, FACOG Obstetrician & Gynecologist, [facility] for [facility]

## 2019-10-05 NOTE — Patient Instructions (Signed)

## 2019-10-25 ENCOUNTER — Ambulatory Visit: Payer: Federal, State, Local not specified - PPO | Admitting: Physician Assistant

## 2019-10-26 ENCOUNTER — Encounter: Payer: Self-pay | Admitting: Internal Medicine

## 2019-10-26 ENCOUNTER — Other Ambulatory Visit: Payer: Self-pay

## 2019-10-26 ENCOUNTER — Ambulatory Visit: Payer: Federal, State, Local not specified - PPO | Admitting: Internal Medicine

## 2019-10-26 VITALS — BP 112/74 | HR 74 | Ht 67.75 in | Wt 194.2 lb

## 2019-10-26 DIAGNOSIS — R768 Other specified abnormal immunological findings in serum: Secondary | ICD-10-CM | POA: Insufficient documentation

## 2019-10-26 DIAGNOSIS — M19042 Primary osteoarthritis, left hand: Secondary | ICD-10-CM | POA: Diagnosis not present

## 2019-10-26 DIAGNOSIS — M19041 Primary osteoarthritis, right hand: Secondary | ICD-10-CM

## 2019-10-26 DIAGNOSIS — R7689 Other specified abnormal immunological findings in serum: Secondary | ICD-10-CM | POA: Insufficient documentation

## 2019-10-26 NOTE — Patient Instructions (Addendum)
Antinuclear Antibody Test Why am I having this test? This is a test that is used to help diagnose systemic lupus erythematosus (SLE) and other autoimmune diseases. An autoimmune disease is a disease in which the body's own defense (immune)system attacks its organs. What is being tested? This test checks for antinuclear antibodies (ANA) in the blood. The presence of ANA is associated with several autoimmune diseases. It is seen in almost all patients with lupus. What kind of sample is taken?  A blood sample is required for this test. It is usually collected by inserting a needle into a blood vessel. How are the results reported? Your test results will be reported as either positive or negative. A false-positive result can occur. A false positive is incorrect because it means that a condition is present when it is not. What do the results mean? A positive test result may mean that you have:  Lupus.  Other autoimmune diseases, such as rheumatoid arthritis, scleroderma, or Sjgren syndrome. Conditions that may cause a false-positive result include:  Liver dysfunction.  Myasthenia gravis.  Infectious mononucleosis. Talk with your health care provider about what your results mean. Questions to ask your health care provider Ask your health care provider, or the department that is doing the test:  When will my results be ready?  How will I get my results?  What are my treatment options?  What other tests do I need?  What are my next steps? Summary  This is a test that is used to help diagnose systemic lupus erythematosus (SLE) and other autoimmune diseases. An autoimmune disease is a disease in which the body's own defense (immune)system attacks the body.  This test checks for antinuclear antibodies (ANA) in the blood. The presence of ANA is associated with several autoimmune diseases. It is seen in almost all patients with lupus.  Your test results will be reported as either  positive or negative. Talk with your health care provider about what your results mean. This information is not intended to replace advice given to you by your health care provider. Make sure you discuss any questions you have with your health care provider. Document Revised: 12/11/2016 Document Reviewed: 08/27/2016 Elsevier Patient Education  2020 Elsevier Inc.  

## 2019-10-26 NOTE — Progress Notes (Signed)
Office Visit Note  Patient: Patricia Conner             Date of Birth: 1960-07-08           MRN: [id number]             PCP: Samuel Bouche, NP Referring: Samuel Bouche, NP Visit Date: 10/26/2019 Occupation: Retired Development worker, community carrier  Subjective:  Abnormal Lab and Joint Pain   History of Present Illness: Patricia Conner is a 59 y.o. female here for evaluation of positive ANA and bilateral hand, wrist, and leg pain. This pain started progressively but has been more bothersome since at least 2 months ago. She has a lot of pain in her hands and wrists with use and difficulty fully bending some fingers. She also has hip, knee, and ankle pain intermittently. She denies catching, weakness, or falls. She has morning stiffness and stiffness after prolonged resting that lasts up to 30 minutes. She has a family history of RA in her father. She denies alopecia, skin rash, eye redness and inflammation, lymphadenopathy, pleurisy, raynaud's or blood clots.  Inflammatory labs reviewed include ESR normal hsCRP 7.8 ANA 1:80 nucleolar RF negative  Imaging reviewed Xrays bilateral hands and feet Right hand mild degenerative changes of DIP joints Left hand degenerative changes more prominent in 2nd, 3rd, and 5th DIPs with central erosion or sawtooth irregularity  Activities of Daily Living:  Patient reports morning stiffness for 30 minutes.   Patient Reports nocturnal pain.  Difficulty dressing/grooming: Denies Difficulty climbing stairs: Denies Difficulty getting out of chair: Denies Difficulty using hands for taps, buttons, cutlery, and/or writing: Reports  Review of Systems  Constitutional: Positive for fatigue.  HENT: Positive for mouth dryness. Negative for mouth sores and nose dryness.   Eyes: Positive for pain. Negative for itching, visual disturbance and dryness.  Respiratory: Positive for shortness of breath. Negative for cough, hemoptysis and difficulty breathing.        Patient complains of  SOB with exertion  Cardiovascular: Positive for palpitations. Negative for chest pain and swelling in legs/feet.       Patient complains of palpitations due to Afib  Gastrointestinal: Negative for abdominal pain, blood in stool, constipation and diarrhea.  Endocrine: Positive for increased urination.  Genitourinary: Negative for painful urination.  Musculoskeletal: Positive for arthralgias, joint pain, joint swelling, myalgias, muscle weakness, morning stiffness and myalgias. Negative for muscle tenderness.  Skin: Negative for color change, rash and redness.  Allergic/Immunologic: Negative for susceptible to infections.  Neurological: Positive for weakness. Negative for dizziness, headaches and memory loss.  Hematological: Negative for swollen glands.  Psychiatric/Behavioral: Positive for confusion and sleep disturbance. Negative for depressed mood. The patient is not nervous/anxious.     PMFS History:  Patient Active Problem List   Diagnosis Date Noted  . Positive ANA (antinuclear antibody) 10/26/2019  . Osteoarthritis of both hands 10/26/2019  . History of atrial fibrillation 02/09/2019  . Encounter to establish care 02/09/2019  . Encounter for preventive care 02/09/2019  . Generalized anxiety disorder 11/14/2008    Past Medical History:  Diagnosis Date  . A-fib ([place])   . Anxiety     Family History  Problem Relation Age of Onset  . Heart attack Father   . Colon cancer Father   . Autoimmune disease Maternal Grandfather    Past Surgical History:  Procedure Laterality Date  . ABDOMINAL HYSTERECTOMY    . CHOLECYSTECTOMY    . FEMUR SURGERY Left   . TUBAL LIGATION  Social History   Social History Narrative  . Not on file   Immunization History  Administered Date(s) Administered  . Influenza,inj,Quad PF,6+ Mos 02/09/2019, 09/22/2019  . Influenza,inj,quad, With Preservative 11/15/2015  . Influenza-Unspecified 11/02/2017  . Janssen (J&J) SARS-COV-2 Vaccination  04/22/2019  . Tdap 07/12/2009, 09/22/2019  . Zoster Recombinat (Shingrix) 07/23/2017, 03/29/2018     Objective: Vital Signs: BP 112/74 (BP Location: Left Arm, Patient Position: Sitting, Cuff Size: Small)   Pulse 74   Ht 5' 7.75" (1.721 m)   Wt 194 lb 3.2 oz (88.1 kg)   BMI 29.75 kg/m    Physical Exam HENT:     Right Ear: External ear normal.     Left Ear: External ear normal.     Nose: Nose normal.     Mouth/Throat:     Mouth: Mucous membranes are moist.     Pharynx: Oropharynx is clear.  Eyes:     Conjunctiva/sclera: Conjunctivae normal.     Pupils: Pupils are equal, round, and reactive to light.  Cardiovascular:     Rate and Rhythm: Normal rate and regular rhythm.  Pulmonary:     Effort: Pulmonary effort is normal.     Breath sounds: Normal breath sounds.  Skin:    General: Skin is warm and dry.      Musculoskeletal Exam:  Neck full ROM no tenderness or swelling Shoulders, elbows, wrists normal ROM no swelling or redness Mild tenderness over PIP and DIP joints of right 2nd and 3rd digit, left 3rd digit, no swelling or erythema and able to close grip Patellofemoral crepitus present in knees with full ROM  CDAI Exam: CDAI Score: -- Patient Global: --; Provider Global: -- Swollen: --; Tender: -- Joint Exam 10/26/2019   No joint exam has been documented for this visit   There is currently no information documented on the homunculus. Go to the Rheumatology activity and complete the homunculus joint exam.  Investigation: No additional findings.  Imaging: No results found.  Recent Labs: Lab Results  Component Value Date   WBC 6.9 09/22/2019   HGB 13.3 09/22/2019   PLT 271 09/22/2019   NA 139 09/22/2019   K 4.0 09/22/2019   CL 105 09/22/2019   CO2 28 09/22/2019   GLUCOSE 120 09/22/2019   BUN 9 09/22/2019   CREATININE 0.74 09/22/2019   BILITOT 0.3 07/03/2019   AST 29 07/03/2019   ALT 33 (H) 07/03/2019   PROT 7.1 07/03/2019   CALCIUM 9.7 09/22/2019    GFRAA 105 09/22/2019    Speciality Comments: No specialty comments available.  Procedures:  No procedures performed Allergies: Patient has no known allergies.   Assessment / Plan:     Visit Diagnoses: Positive ANA (antinuclear antibody) - Plan: C3 and C4, Anti-DNA antibody, double-stranded, Anti-Smith antibody  Positive ANA but does not show clinical criteria of lupus. Serology negative for RF and no obvious inflammatory changes on exam or imaging. Will check antibodies more specific for SLE and if positive would follow for clinical changes otherwise if negative low risk to develop disease.  Primary osteoarthritis of both hands Xray changes are characteristic of erosive osteoarthritis. This is a more inflammatory form of primary OA but maintenance therapy with DMARDs has not demonstrated clinical benefit in studies for this. Erosive OA not usually associated with systemic connective tissue disease. NSAIDs either topical or systemic are recommended for symptomatic treatment. Intraarticular steroid injection can be beneficial if particular joint or two become more inflamed as needed. Not currently in exacerbation  at this time.  Orders: Orders Placed This Encounter  Procedures  . C3 and C4  . Anti-DNA antibody, double-stranded  . Anti-Smith antibody  . Sjogrens syndrome-A extractable nuclear antibody  . Sjogrens syndrome-B extractable nuclear antibody  . Centromere Antibodies   No orders of the defined types were placed in this encounter.    Follow-Up Instructions: No follow-ups on file.   Collier Salina, MD  Note - This record has been created using [employer].  Chart creation errors have been sought, but may not always  have been located. Such creation errors do not reflect on  the standard of medical care.

## 2019-10-27 LAB — C3 AND C4
C3 Complement: 158 mg/dL (ref 83–193)
C4 Complement: 28 mg/dL (ref 15–57)

## 2019-10-27 LAB — CENTROMERE ANTIBODIES: Centromere Ab Screen: <1 AI

## 2019-10-27 LAB — SJOGRENS SYNDROME-A EXTRACTABLE NUCLEAR ANTIBODY: SSA (Ro) (ENA) Antibody, IgG: <1 AI

## 2019-10-27 LAB — ANTI-SMITH ANTIBODY: ENA SM Ab Ser-aCnc: <1 AI

## 2019-10-27 LAB — SJOGRENS SYNDROME-B EXTRACTABLE NUCLEAR ANTIBODY: SSB (La) (ENA) Antibody, IgG: <1 AI

## 2019-10-27 LAB — ANTI-DNA ANTIBODY, DOUBLE-STRANDED: ds DNA Ab: <1 IU/mL

## 2019-10-31 ENCOUNTER — Other Ambulatory Visit: Payer: Self-pay | Admitting: Obstetrics & Gynecology

## 2019-10-31 DIAGNOSIS — R32 Unspecified urinary incontinence: Secondary | ICD-10-CM

## 2019-12-03 ENCOUNTER — Other Ambulatory Visit: Payer: Self-pay | Admitting: Obstetrics & Gynecology

## 2019-12-03 DIAGNOSIS — R32 Unspecified urinary incontinence: Secondary | ICD-10-CM

## 2019-12-11 ENCOUNTER — Ambulatory Visit: Payer: Federal, State, Local not specified - PPO | Admitting: Dermatology

## 2020-01-01 ENCOUNTER — Other Ambulatory Visit: Payer: Self-pay | Admitting: Obstetrics & Gynecology

## 2020-01-01 DIAGNOSIS — R32 Unspecified urinary incontinence: Secondary | ICD-10-CM

## 2020-01-26 ENCOUNTER — Other Ambulatory Visit: Payer: Self-pay | Admitting: Obstetrics & Gynecology

## 2020-01-26 DIAGNOSIS — R32 Unspecified urinary incontinence: Secondary | ICD-10-CM

## 2020-02-02 ENCOUNTER — Emergency Department (INDEPENDENT_AMBULATORY_CARE_PROVIDER_SITE_OTHER): Payer: Federal, State, Local not specified - PPO

## 2020-02-02 ENCOUNTER — Emergency Department
Admission: EM | Admit: 2020-02-02 | Discharge: 2020-02-02 | Disposition: A | Discharge status: Home / Self Care | Payer: Federal, State, Local not specified - PPO | Source: Home / Self Care | Attending: Family Medicine | Admitting: Family Medicine

## 2020-02-02 ENCOUNTER — Telehealth: Payer: Federal, State, Local not specified - PPO | Admitting: Physician Assistant

## 2020-02-02 ENCOUNTER — Other Ambulatory Visit: Payer: Self-pay

## 2020-02-02 DIAGNOSIS — J069 Acute upper respiratory infection, unspecified: Secondary | ICD-10-CM

## 2020-02-02 DIAGNOSIS — R0789 Other chest pain: Secondary | ICD-10-CM | POA: Diagnosis not present

## 2020-02-02 DIAGNOSIS — R059 Cough, unspecified: Secondary | ICD-10-CM

## 2020-02-02 DIAGNOSIS — R0602 Shortness of breath: Secondary | ICD-10-CM | POA: Diagnosis not present

## 2020-02-02 DIAGNOSIS — M940 Chondrocostal junction syndrome [Tietze]: Secondary | ICD-10-CM

## 2020-02-02 DIAGNOSIS — R079 Chest pain, unspecified: Secondary | ICD-10-CM

## 2020-02-02 DIAGNOSIS — R509 Fever, unspecified: Secondary | ICD-10-CM | POA: Diagnosis not present

## 2020-02-02 NOTE — ED Provider Notes (Signed)
Pat[facility] Con[facility]    CSN: [id number] Arrival date & time: 02/02/20  1447      History   Chief Complaint Chief Complaint  Patient presents with  . Fever  . Emesis    HPI Patricia Conner is a 60 y.o. female.   Two days ago patient developed a mild sore throat, low grade fever, mild cough, tightness in her anterior chest, and arthralgias in her wrists.  She has had nausea and vomiting (resolved).  She states that she has had pneumonia in the past, and her present symptoms feel similar.  She states that she had a negative home COVID test yesterday.  The history is provided by the patient.    Past Medical History:  Diagnosis Date  . A-fib ([facility])   . Anxiety     Patient Active Problem List   Diagnosis Date Noted  . Positive ANA (antinuclear antibody) 10/26/2019  . Osteoarthritis of both hands 10/26/2019  . History of atrial fibrillation 02/09/2019  . Encounter to establish care 02/09/2019  . Encounter for preventive care 02/09/2019  . Generalized anxiety disorder 11/14/2008    Past Surgical History:  Procedure Laterality Date  . ABDOMINAL HYSTERECTOMY    . CHOLECYSTECTOMY    . FEMUR SURGERY Left   . TUBAL LIGATION      OB History    Gravida  3   Para  3   Term  3   Preterm      AB      Living  3     SAB      IAB      Ectopic      Multiple      Live Births               Home Medications    Prior to Admission medications   Medication Sig Start Date End Date Taking? Authorizing Provider  DULoxetine (CYMBALTA) 60 MG capsule Take 1 capsule (60 mg total) by mouth daily. 03/01/19   Christen Butter, NP  ibuprofen (ADVIL) 800 MG tablet Take 1 tablet (800 mg total) by mouth every 8 (eight) hours as needed. 09/22/19   Christen Butter, NP  tolterodine (DETROL LA) 2 MG 24 hr capsule TAKE 1 CAPSULE BY MOUTH DAILY. 01/30/20   Tereso Newcomer, MD    Family History Family History  Problem Relation Age of Onset  . Heart attack Father   . Colon cancer  Father   . Autoimmune disease Maternal Grandfather     Social History Social History   Tobacco Use  . Smoking status: Never Smoker  . Smokeless tobacco: Never Used  Vaping Use  . Vaping Use: Never used  Substance Use Topics  . Alcohol use: Yes    Comment: Occasionally  . Drug use: Never     Allergies   Patient has no known allergies.   Review of Systems Review of Systems  + sore throat + cough No pleuritic pain, but feels tight in anterior chest No wheezing No nasal congestion No post-nasal drainage No sinus pain/pressure No itchy/red eyes No earache No hemoptysis No SOB + fever, + chills + nausea + vomiting, resolved No abdominal pain No diarrhea No urinary symptoms No skin rash + fatigue No myalgias + arthralgias + headache Used OTC meds (Tylenol) without relief    Physical Exam Triage Vital Signs ED Triage Vitals  Enc Vitals Group     BP 02/02/20 1459 123/82     Pulse Rate 02/02/20 1459 95  Resp 02/02/20 1459 18     Temp 02/02/20 1459 98.9 F (37.2 C)     Temp Source 02/02/20 1459 Oral     SpO2 02/02/20 1459 97 %     Weight --      Height --      Head Circumference --      Peak Flow --      Pain Score 02/02/20 1458 2     Pain Loc --      Pain Edu? --      Excl. in GC? --    No data found.  Updated Vital Signs BP 123/82 (BP Location: Right Arm)   Pulse 95   Temp 98.9 F (37.2 C) (Oral)   Resp 18   SpO2 97%   Visual Acuity Right Eye Distance:   Left Eye Distance:   Bilateral Distance:    Right Eye Near:   Left Eye Near:    Bilateral Near:     Physical Exam Nursing notes and Vital Signs reviewed. Appearance:  Patient appears stated age, and in no acute distress Eyes:  Pupils are equal, round, and reactive to light and accomodation.  Extraocular movement is intact.  Conjunctivae are not inflamed  Ears:  Canals normal.  Tympanic membranes normal.  Nose:  Mildly congested turbinates.  No sinus tenderness.    Pharynx:   Normal Neck:  Supple.  No adenopathy.  Lungs:  Clear to auscultation.  Breath sounds are equal.  Moving air well. Chest:  Distinct tenderness to palpation over the mid-sternum.  Heart:  Regular rate and rhythm without murmurs, rubs, or gallops.  Abdomen:  Nontender without masses or hepatosplenomegaly.  Bowel sounds are present.  No CVA or flank tenderness.  Extremities:  No edema.  Skin:  No rash present.   UC Treatments / Results  Labs (all labs ordered are listed, but only abnormal results are displayed) Labs Reviewed  NOVEL CORONAVIRUS, NAA    EKG   Radiology DG Chest 2 View  Result Date: 02/02/2020 CLINICAL DATA:  Cough and anterior chest tightness for 2 days. Fever, emesis, and shortness of breath. EXAM: CHEST - 2 VIEW COMPARISON:  None. FINDINGS: The heart size and mediastinal contours are within normal limits. Both lungs are clear. The visualized skeletal structures are unremarkable. IMPRESSION: No active cardiopulmonary disease. Electronically Signed   By: Gaylyn Rong M.D.   On: 02/02/2020 16:30    Procedures Procedures (including critical care time)  Medications Ordered in UC Medications - No data to display  Initial Impression / Assessment and Plan / UC Course  I have reviewed the triage vital signs and the nursing notes.  Pertinent labs & imaging results that were available during my care of the patient were reviewed by me and considered in my medical decision making (see chart for details).    There is no evidence of bacterial infection today.  Treat symptomatically for now  COVID19 PCR pending. Followup with Family Doctor if not improved in one week.    Final Clinical Impressions(s) / UC Diagnoses   Final diagnoses:  Viral URI with cough  Costochondritis     Discharge Instructions     Take plain guaifenesin (1200mg  extended release tabs such as Mucinex) twice daily, with plenty of water, for cough and congestion. Get adequate rest.   Try warm  salt water gargles for sore throat.  Stop all antihistamines for now, and other non-prescription cough/cold preparations. May take Ibuprofen 200mg , 4 tabs every 8 hours with food  for chest/sternum discomfort, joint pain, etc. May take Delsym Cough Suppressant ("12 Hour Cough Relief") at bedtime for nighttime cough.    If your COVID-19 test is positive, isolate yourself for five days from the time of your symptom onset  At the end of five days you may end isolation if your symptoms have cleared or improved, and you have not had a fever for 24 hours. At this time you should wear a mask for five more days when you are around others.            ED Prescriptions    None        Lattie Haw, MD 02/03/20 1530

## 2020-02-02 NOTE — Discharge Instructions (Addendum)
Take plain guaifenesin (1200mg  extended release tabs such as Mucinex) twice daily, with plenty of water, for cough and congestion. Get adequate rest.   Try warm salt water gargles for sore throat.  Stop all antihistamines for now, and other non-prescription cough/cold preparations. May take Ibuprofen 200mg , 4 tabs every 8 hours with food for chest/sternum discomfort, joint pain, etc. May take Delsym Cough Suppressant ("12 Hour Cough Relief") at bedtime for nighttime cough.    If your COVID-19 test is positive, isolate yourself for five days from the time of your symptom onset  At the end of five days you may end isolation if your symptoms have cleared or improved, and you have not had a fever for 24 hours. At this time you should wear a mask for five more days when you are around others.

## 2020-02-02 NOTE — Progress Notes (Signed)
Hi Patricia Conner,   I am sorry you are not feeling well.  Yes, there is a chance you could have received a false negative COVID test. There are several testing centers where you can receive a PCR test, which is more accurate. However I am concerned about some of the other symptoms you mentioned like your heart rate increasing and your chest pain. I would feel more comfortable if you were evaluated in person.   Based on what you shared with me, I feel your condition warrants further evaluation and I recommend that you be seen for a face to face office visit.   NOTE: If you entered your credit card information for this eVisit, you will not be charged. You may see a "hold" on your card for the $35 but that hold will drop off and you will not have a charge processed.   If you are having a true medical emergency please call 911.      For an urgent face to face visit, [place] has five urgent care centers for your convenience:     [facility] at [facility] Directions [phone] [address] [place], [place] [address] . 10 am - 6pm Monday - Friday    [facility]gent [facility]) Get Driving Directions [phone] [address] [place], [place] [address] . 10 am to 8 pm Monday-Friday . 12 pm to 8 pm [facility] Urgent Care at [facility] Get Driving Directions [phone] [address] [place], [place] [address] . 8 am to 8 pm Monday-Friday . 9 am to 6 pm Saturday . 11 am to 6 pm Sunday     [facility] Urgent Care at [facility] Get Driving Directions  [phone] [address] [place], [place] [address] . 8 am to 8 pm Monday-Friday . 8 am to 4 pm [facility] Urgent Care at [facility] Directions [phone] [address] [place], [place] [address] . 12 pm to 6 pm Monday-Friday      Your e-visit answers were  reviewed by a board certified advanced clinical practitioner to complete your personal care plan.  Thank you for using e-Visits.

## 2020-02-02 NOTE — ED Triage Notes (Signed)
Patient presents to Urgent Care with complaints of fever, emesis, and shortness of breath since 3-4 days ago. Patient reports she had a headache last week, not sure if related. Pt took a home covid test yesterday and it was negative.  Pt has been vaccinated for covid.

## 2020-02-04 LAB — SARS-COV-2, NAA 2 DAY TAT

## 2020-02-04 LAB — NOVEL CORONAVIRUS, NAA: SARS-CoV-2, NAA: DETECTED — AB

## 2020-02-29 ENCOUNTER — Other Ambulatory Visit: Payer: Self-pay | Admitting: Obstetrics & Gynecology

## 2020-02-29 DIAGNOSIS — R32 Unspecified urinary incontinence: Secondary | ICD-10-CM

## 2020-03-25 ENCOUNTER — Other Ambulatory Visit: Payer: Self-pay | Admitting: Medical-Surgical

## 2020-03-25 DIAGNOSIS — F411 Generalized anxiety disorder: Secondary | ICD-10-CM

## 2020-05-09 NOTE — Progress Notes (Signed)
Subjective:    CC: Back pain  HPI: Pleasant 60 year old female presenting today for evaluation of back pain.  Notes that she had back injury approximately 3 years ago was treated with injections.  The injections did very well for her and she has not had any issues until approximately 1 and half weeks ago.  Notes that her back pain resumed suddenly with no identifiable contributors.  No recent falls or injuries.  Her back pain affects the right lower back and she has had some sciatica in the right leg.  Occasionally experiences numbness and tingling all the way down to her toes.  At times, feels that the leg is a little weaker than usual.  No saddle paresthesias or new onset incontinence.  She has been taking Advil at home and doing stretches.  She did physical therapy several years ago and does still have the equipment at home to be able to do these exercises.  Reports her pain is worse when bending forward.  I reviewed the past medical history, family history, social history, surgical history, and allergies today and no changes were needed.  Please see the problem list section below in epic for further details.  Past Medical History: Past Medical History:  Diagnosis Date  . A-fib ([facility])   . Anxiety    Past Surgical History: Past Surgical History:  Procedure Laterality Date  . ABDOMINAL HYSTERECTOMY    . CHOLECYSTECTOMY    . FEMUR SURGERY Left   . TUBAL LIGATION     Social History: Social History   Socioeconomic History  . Marital status: Single    Spouse name: Not on file  . Number of children: Not on file  . Years of education: Not on file  . Highest education level: Not on file  Occupational History  . Occupation: Retired  Tobacco Use  . Smoking status: Never Smoker  . Smokeless tobacco: Never Used  Vaping Use  . Vaping Use: Never used  Substance and Sexual Activity  . Alcohol use: Yes    Comment: Occasionally  . Drug use: Never  . Sexual activity: Not Currently     Partners: Male    Birth control/protection: Surgical  Other Topics Concern  . Not on file  Social History Narrative  . Not on file   Social Determinants of Health   Financial Resource Strain: Not on file  Food Insecurity: Not on file  Transportation Needs: Not on file  Physical Activity: Not on file  Stress: Not on file  Social Connections: Not on file   Family History: Family History  Problem Relation Age of Onset  . Heart attack Father   . Colon cancer Father   . Autoimmune disease Maternal Grandfather    Allergies: No Known Allergies Medications: See med rec.  Review of Systems: See HPI for pertinent positives and negatives.   Objective:    General: Well Developed, well nourished, and in no acute distress.  Neuro: Alert and oriented x3.  HEENT: Normocephalic, atraumatic.  Skin: Warm and dry. Cardiac: Regular rate and rhythm, no murmurs rubs or gallops, no lower extremity edema.  Respiratory: Clear to auscultation bilaterally. Not using accessory muscles, speaking in full sentences.   Impression and Recommendations:    1. Right-sided low back pain with right-sided sciatica, unspecified chronicity POCT UA negative.  Getting updated lumbar spine x-rays today.  Start prednisone 50 mg daily x5 days.  Once this is completed, switch to anti-inflammatories such as ibuprofen or Advil.  Low back pain exercises  provided to complete at home.  Discussed other conservative measures including heat, ice, massage, and topical therapies. - POCT URINALYSIS DIP (CLINITEK) - DG Lumbar Spine Complete; Future  Return in about 4 weeks (around 06/07/2020) for back pain follow up. ___________________________________________ Thayer Ohm, DNP, APRN, FNP-BC Primary Care and Sports Medicine [facility] [place]

## 2020-05-10 ENCOUNTER — Ambulatory Visit: Payer: Federal, State, Local not specified - PPO | Admitting: Medical-Surgical

## 2020-05-10 ENCOUNTER — Ambulatory Visit (INDEPENDENT_AMBULATORY_CARE_PROVIDER_SITE_OTHER): Payer: Federal, State, Local not specified - PPO

## 2020-05-10 ENCOUNTER — Other Ambulatory Visit: Payer: Self-pay

## 2020-05-10 ENCOUNTER — Encounter: Payer: Self-pay | Admitting: Medical-Surgical

## 2020-05-10 VITALS — BP 113/74 | HR 87 | Temp 98.4°F | Ht 67.75 in | Wt 197.1 lb

## 2020-05-10 DIAGNOSIS — M5441 Lumbago with sciatica, right side: Secondary | ICD-10-CM

## 2020-05-10 DIAGNOSIS — M545 Low back pain, unspecified: Secondary | ICD-10-CM | POA: Diagnosis not present

## 2020-05-10 DIAGNOSIS — M549 Dorsalgia, unspecified: Secondary | ICD-10-CM

## 2020-05-10 LAB — POCT URINALYSIS DIP (CLINITEK)
Bilirubin, UA: NEGATIVE
Blood, UA: NEGATIVE
Glucose, UA: NEGATIVE mg/dL
Ketones, POC UA: NEGATIVE mg/dL
Leukocytes, UA: NEGATIVE
Nitrite, UA: NEGATIVE
POC PROTEIN,UA: NEGATIVE
Spec Grav, UA: 1.015 (ref 1.010–1.025)
Urobilinogen, UA: 0.2 E.U./dL
pH, UA: 5.5 (ref 5.0–8.0)

## 2020-05-10 MED ORDER — PREDNISONE 50 MG PO TABS: 50.0000 mg | ORAL_TABLET | Freq: Every day | ORAL | 0 refills | Status: DC

## 2020-06-07 ENCOUNTER — Ambulatory Visit: Payer: Federal, State, Local not specified - PPO | Admitting: Medical-Surgical

## 2020-06-07 DIAGNOSIS — M5441 Lumbago with sciatica, right side: Secondary | ICD-10-CM

## 2020-06-17 ENCOUNTER — Encounter: Payer: Self-pay | Admitting: Medical-Surgical

## 2020-06-17 ENCOUNTER — Other Ambulatory Visit: Payer: Self-pay

## 2020-06-17 ENCOUNTER — Ambulatory Visit: Payer: Federal, State, Local not specified - PPO | Admitting: Medical-Surgical

## 2020-06-17 VITALS — BP 104/71 | HR 76 | Temp 99.2°F | Ht 67.75 in | Wt 199.4 lb

## 2020-06-17 DIAGNOSIS — G8929 Other chronic pain: Secondary | ICD-10-CM | POA: Diagnosis not present

## 2020-06-17 DIAGNOSIS — F411 Generalized anxiety disorder: Secondary | ICD-10-CM

## 2020-06-17 DIAGNOSIS — M5441 Lumbago with sciatica, right side: Secondary | ICD-10-CM

## 2020-06-17 MED ORDER — MELOXICAM 15 MG PO TABS: 15.0000 mg | ORAL_TABLET | Freq: Every day | ORAL | 0 refills | Status: DC

## 2020-06-17 MED ORDER — DULOXETINE HCL 60 MG PO CPEP: 60.0000 mg | ORAL_CAPSULE | Freq: Every day | ORAL | 1 refills | Status: DC

## 2020-06-17 NOTE — Progress Notes (Signed)
Subjective:    CC: Back pain follow-up  HPI: Pleasant 60 year old female presenting today for follow-up on low back pain with paresthesias/sciatica affecting the right lower extremity.  She has been doing home exercises as instructed and staying very busy throughout the rest of her days.  She is also using ibuprofen 800 mg 2-3 times daily with some temporary relief of her pain.  Taking Cymbalta 60 mg nightly as prescribed.  Does note that she had an MRI about 4 years ago but this is not accessible in our system.  Recent lumbar spine x-rays showed lumbar facet degeneration but no other abnormalities.  Had an injection done by radiology a little over a year ago in [place] and feels that she would like to proceed with another injection since it did provide her with nearly 1 full year of symptom relief.  Denies new onset urinary incontinence, saddle paresthesias, and progressive weakness of the lower extremities.  I reviewed the past medical history, family history, social history, surgical history, and allergies today and no changes were needed.  Please see the problem list section below in epic for further details.  Past Medical History: Past Medical History:  Diagnosis Date  . A-fib (HCC)   . Anxiety    Past Surgical History: Past Surgical History:  Procedure Laterality Date  . ABDOMINAL HYSTERECTOMY    . CHOLECYSTECTOMY    . FEMUR SURGERY Left   . TUBAL LIGATION     Social History: Social History   Socioeconomic History  . Marital status: Single    Spouse name: Not on file  . Number of children: Not on file  . Years of education: Not on file  . Highest education level: Not on file  Occupational History  . Occupation: Retired  Tobacco Use  . Smoking status: Never Smoker  . Smokeless tobacco: Never Used  Vaping Use  . Vaping Use: Never used  Substance and Sexual Activity  . Alcohol use: Yes    Comment: Occasionally  . Drug use: Never  . Sexual activity: Not Currently     Partners: Male    Birth control/protection: Surgical  Other Topics Concern  . Not on file  Social History Narrative  . Not on file   Social Determinants of Health   Financial Resource Strain: Not on file  Food Insecurity: Not on file  Transportation Needs: Not on file  Physical Activity: Not on file  Stress: Not on file  Social Connections: Not on file   Family History: Family History  Problem Relation Age of Onset  . Heart attack Father   . Colon cancer Father   . Autoimmune disease Maternal Grandfather    Allergies: No Known Allergies Medications: See med rec.  Review of Systems: See HPI for pertinent positives and negatives.   Objective:    General: Well Developed, well nourished, and in no acute distress.  Neuro: Alert and oriented x3.  HEENT: Normocephalic, atraumatic.  Skin: Warm and dry. Cardiac: Regular rate and rhythm, no murmurs rubs or gallops, no lower extremity edema.  Respiratory: Clear to auscultation bilaterally. Not using accessory muscles, speaking in full sentences.  Impression and Recommendations:    1. Chronic right-sided low back pain with right-sided sciatica Since her imaging is not available in the system and it has been for years, updating MRI of the lumbar spine.  Discontinue ibuprofen.  Sending in meloxicam 15 mg daily for ease of dosing.  Advised patient to avoid other ibuprofen type products while taking meloxicam.  Continue home physical therapy exercises.  Recommend focusing on building core and back strength to help alleviate symptoms. - MR Lumbar Spine Wo Contrast; Future  Return if symptoms worsen or fail to improve. ___________________________________________ Thayer Ohm, DNP, APRN, FNP-BC Primary Care and Sports Medicine [facility]

## 2020-06-22 ENCOUNTER — Other Ambulatory Visit: Payer: Self-pay

## 2020-06-22 ENCOUNTER — Ambulatory Visit (INDEPENDENT_AMBULATORY_CARE_PROVIDER_SITE_OTHER): Payer: Federal, State, Local not specified - PPO

## 2020-06-22 DIAGNOSIS — M5441 Lumbago with sciatica, right side: Secondary | ICD-10-CM

## 2020-06-22 DIAGNOSIS — G8929 Other chronic pain: Secondary | ICD-10-CM | POA: Diagnosis not present

## 2020-06-22 DIAGNOSIS — M545 Low back pain, unspecified: Secondary | ICD-10-CM | POA: Diagnosis not present

## 2020-06-25 ENCOUNTER — Encounter: Payer: Self-pay | Admitting: Medical-Surgical

## 2020-06-26 ENCOUNTER — Ambulatory Visit: Payer: Federal, State, Local not specified - PPO | Admitting: Sports Medicine

## 2020-06-26 ENCOUNTER — Other Ambulatory Visit: Payer: Self-pay

## 2020-06-26 DIAGNOSIS — M47816 Spondylosis without myelopathy or radiculopathy, lumbar region: Secondary | ICD-10-CM

## 2020-06-26 MED ORDER — TRAMADOL HCL 50 MG PO TABS: 50.0000 mg | ORAL_TABLET | Freq: Three times a day (TID) | ORAL | 0 refills | Status: DC | PRN

## 2020-06-26 NOTE — Assessment & Plan Note (Signed)
Chronic right-sided low back pain, she is having some left-sided symptoms now as well, I did review her MRI, she has had failure of NSAIDs, home conditioning. We are going to proceed with a right L5-S1 facet joint injection, followed by medial branch blocks and RFA if insufficient duration of relief. We will also add aggressive formal physical therapy, return to see me in 1 month.

## 2020-06-26 NOTE — Progress Notes (Signed)
    Procedures performed today:    None.  Independent interpretation of notes and tests performed by another provider:   MRI personally reviewed, discs look okay, she does have a fairly arthritic right L5-S1 facet that I think is her pain generator.  Brief History, Exam, Impression, and Recommendations:    Lumbar spondylosis Chronic right-sided low back pain, she is having some left-sided symptoms now as well, I did review her MRI, she has had failure of NSAIDs, home conditioning. We are going to proceed with a right L5-S1 facet joint injection, followed by medial branch blocks and RFA if insufficient duration of relief. We will also add aggressive formal physical therapy, return to see me in 1 month.    ___________________________________________ Ihor Austin. Benjamin Stain, M.D., ABFM., CAQSM. Primary Care and Sports Medicine  [facility]  Adjunct Instructor of Family Medicine  [employer]

## 2020-06-27 ENCOUNTER — Other Ambulatory Visit: Payer: Self-pay

## 2020-06-27 DIAGNOSIS — Z87898 Personal history of other specified conditions: Secondary | ICD-10-CM

## 2020-06-27 DIAGNOSIS — Z1231 Encounter for screening mammogram for malignant neoplasm of breast: Secondary | ICD-10-CM

## 2020-06-27 NOTE — Telephone Encounter (Signed)
Radiology results from Breast imaging is under the Media tab as a scan on the date of 09/01/2020. Please contact the [facility] in [place] and let them know that the information is in there and they need to get her scheduled for her imaging since she is past due.   Thayer Ohm, DNP, APRN, FNP-BC [place] [facility] and [facility]

## 2020-06-28 ENCOUNTER — Other Ambulatory Visit: Payer: Self-pay | Admitting: Medical-Surgical

## 2020-06-28 DIAGNOSIS — Z1231 Encounter for screening mammogram for malignant neoplasm of breast: Secondary | ICD-10-CM

## 2020-06-28 NOTE — Telephone Encounter (Signed)
Called and LVM for pt and also sent a portal message. Please see messag dated 06/28/2020.

## 2020-07-01 ENCOUNTER — Other Ambulatory Visit: Payer: Self-pay

## 2020-07-01 ENCOUNTER — Ambulatory Visit: Payer: Federal, State, Local not specified - PPO | Admitting: Rehabilitative and Restorative Service Providers"

## 2020-07-01 ENCOUNTER — Other Ambulatory Visit: Payer: Self-pay | Admitting: Medical-Surgical

## 2020-07-01 DIAGNOSIS — N6459 Other signs and symptoms in breast: Secondary | ICD-10-CM

## 2020-07-01 DIAGNOSIS — M5440 Lumbago with sciatica, unspecified side: Secondary | ICD-10-CM

## 2020-07-01 DIAGNOSIS — R293 Abnormal posture: Secondary | ICD-10-CM

## 2020-07-01 DIAGNOSIS — M6281 Muscle weakness (generalized): Secondary | ICD-10-CM

## 2020-07-01 NOTE — Patient Instructions (Signed)
Access Code: [email or web address] URL: [email or web address] Date: 07/01/2020 Prepared by: Margretta Ditty  Exercises Erby Pian on Table - 2 x daily - 7 x weekly - 1 sets - 2 reps - 20-30 seconds hold Cat Cow to Child's Pose - 2 x daily - 7 x weekly - 1 sets - 10 reps Standing Quadratus Lumborum Stretch with Doorway - 2 x daily - 7 x weekly - 1 sets - 3 reps - 20-30 seconds hold  Patient Education Trigger Point Dry Needling

## 2020-07-01 NOTE — Therapy (Signed)
[facility] [place] [address] [place] [address] [place], [place], [address] Phone: [phone]   Fax:  [phone]  Physical Therapy Evaluation  Patient Details  Name: Patricia Conner MRN: [id number] Date of Birth: 10-20-1960 Referring Provider (PT): Blima Ledger, MD   Encounter Date: 07/01/2020   PT End of Session - 07/01/20 0850     Visit Number 1    Number of Visits 12    Date for PT Re-Evaluation 08/12/20    Authorization Type BCBS federal    PT Start Time 0801    PT Stop Time 0846    PT Time Calculation (min) 45 min    Activity Tolerance Patient tolerated treatment well    Behavior During Therapy [facility] for tasks assessed/performed             Past Medical History:  Diagnosis Date   A-fib (HCC)    Anxiety     Past Surgical History:  Procedure Laterality Date   ABDOMINAL HYSTERECTOMY     CHOLECYSTECTOMY     FEMUR SURGERY Left    TUBAL LIGATION      There were no vitals filed for this visit.    Subjective Assessment - 07/01/20 0803     Subjective The patient reports chronic R sided low back that radiates laterally into the R knee and upwards into the QL and thoracic region.  She had injections 4 years ago with relief and is scheduled to undergo injections on Wednesday.  Pain flared up 2.5-3 weeks ago and is disturbing sleep (she is a side sleeper)    Patient Stated Goals To be pain free    Currently in Pain? Yes    Pain Score 3     Pain Location Back    Pain Orientation Right;Lower    Pain Descriptors / Indicators Discomfort    Pain Type Chronic pain    Pain Radiating Towards R knee    Pain Onset More than a month ago    Pain Frequency Intermittent    Aggravating Factors  walking, inclines (walks >1 mile)    Pain Relieving Factors sitting                [facility] PT Assessment - 07/01/20 0001       Assessment   Medical Diagnosis lumbar spondylosis    Referring Provider (PT) Blima Ledger, MD     Onset Date/Surgical Date 06/26/20    Hand Dominance Right      Precautions   Precautions None      Restrictions   Weight Bearing Restrictions No      Balance Screen   Has the patient fallen in the past 6 months No    Has the patient had a decrease in activity level because of a fear of falling?  No    Is the patient reluctant to leave their home because of a fear of falling?  No      Home Tourist information centre manager residence      Prior Function   Level of Independence Independent    Vocation Retired    Leisure horses, works at barn      Observation/Other [employer] on Therapeutic Outcomes (FOTO)  55%      ROM / Strength   AROM / PROM / Strength AROM;Strength      AROM   Overall AROM Comments 50% limitation in all planes; R SB and rot hurt on R and pull to the L.  AROM Assessment Site Lumbar    Lumbar Flexion 50% limitation    Lumbar Extension 50% limitation    Lumbar - Right Side Bend 50% limitation    Lumbar - Left Side Bend 50% limitation    Lumbar - Right Rotation 50% limitation    Lumbar - Left Rotation 50% limitation      Strength   Strength Assessment Site Hip;Knee;Ankle    Right/Left Hip Right;Left    Right Hip Flexion 3/5   pain in R SI region wiht resisted hip flexion   Left Hip Flexion 5/5    Right/Left Knee Right;Left    Right Knee Flexion 5/5    Right Knee Extension 5/5    Left Knee Flexion 5/5    Left Knee Extension 5/5    Right/Left Ankle Right;Left    Right Ankle Dorsiflexion 4/5    Right Ankle Eversion 5/5    Left Ankle Dorsiflexion 4/5   moves into DF with inversion   Left Ankle Eversion 4/5      Flexibility   Soft Tissue Assessment /Muscle Length yes   hip flexor tightness + thomas test stretch bilaterally   Quadriceps bilateral tightness mild      Palpation   Spinal mobility hypomobility with tendernss mid t-spine through thoracolumbar junction    SI assessment  tender R PSIS palpation    Palpation comment QL  myofascial tightness , significant tightness bilateral lumbar paraspinals                        Objective measurements completed on examination: See above findings.       [facility] Adult PT Treatment/Exercise - 07/01/20 0825       Exercises   Exercises Lumbar      Lumbar Exercises: Stretches   Hip Flexor Stretch Right;Left;1 rep;60 seconds    Hip Flexor Stretch Limitations pain when PT provides overpressure    Prone on Elbows Stretch 1 rep;60 seconds    Prone on Elbows Stretch Limitations some increase in pain    Other Lumbar Stretch Exercise child's pose with lateral UE movement    Other Lumbar Stretch Exercise standing QL stretch in door frame      Lumbar Exercises: Quadruped   Madcat/Old Horse 10 reps              Trigger Point Dry Needling - 07/01/20 0847     Consent Given? Yes    Education Handout Provided Yes    Muscles Treated Back/Hip Lumbar multifidi;Quadratus lumborum                  PT Education - 07/01/20 0849     Education Details HEP    Person(s) Educated Patient    Methods Explanation;Demonstration;Handout    Comprehension Verbalized understanding;Returned demonstration                 PT Long Term Goals - 07/01/20 0851       PT LONG TERM GOAL #1   Title The patient will be indep with HEP.    Time 6    Period Weeks    Target Date 08/12/20      PT LONG TERM GOAL #2   Title The patient will improve functional status score from 55% up to 71%.    Time 6    Period Weeks    Target Date 08/12/20      PT LONG TERM GOAL #3   Title The patient will report reduction in  muscle spasms at night by 50%.    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    Time 6    Period Weeks    Target Date 08/12/20      PT LONG TERM GOAL #5   Title The patient will report no pain at rest.    Time 6    Period Weeks     Target Date 08/12/20                    Plan - 07/01/20 9563     Clinical Impression Statement The patient is a 60 yo female presenting to OP physical therapy with chronic h/o R LBP worsened in the past 3 weeks.  She presents with spinal hypomobility CPA and UPA, myofascial tightness in QL, dec'd flexibility in hip flexors and HS, dec'd lumbar ROM, tightness in mid-thoracic spine, pain at rest and with mobility, dec'd strength in R hip flexors.  PT to address deficits to return to prior functional status.    Personal Factors and Comorbidities Time since onset of injury/illness/exacerbation    Examination-Activity Limitations Lift;Locomotion Level;Squat;Stairs;Stand    Examination-Participation Restrictions Community Activity;Occupation    Stability/Clinical Decision Making Stable/Uncomplicated    Clinical Decision Making Low    Rehab Potential Good    PT Frequency 2x / week    PT Duration 6 weeks    PT Treatment/Interventions ADLs/Self Care Home Management;Patient/family education;Therapeutic exercise;Therapeutic activities;Manual techniques;Dry needling;Taping;Aquatic Therapy;Electrical Stimulation;Cryotherapy;Moist Heat;Traction;Gait training    PT Next Visit Plan check HEP, DN/STM and self mob for home for QL, progress LE and core strength, work on ankle and hip flexor strength, body mechanics for work    PT Home Exercise Plan Access Code: [email or web address]    Consulted and Agree with Plan of Care Patient             Patient will benefit from skilled therapeutic intervention in order to improve the following deficits and impairments:  Hypomobility, Pain, Impaired flexibility, Postural dysfunction, Decreased range of motion, Decreased strength, Decreased activity tolerance  Visit Diagnosis: Acute right-sided low back pain with sciatica, sciatica laterality unspecified  Muscle weakness (generalized)  Posture abnormality     Problem List Patient Active Problem List    Diagnosis Date Noted   Lumbar spondylosis 06/26/2020   Positive ANA (antinuclear antibody) 10/26/2019   Osteoarthritis of both hands 10/26/2019   History of atrial fibrillation 02/09/2019   Encounter to establish care 02/09/2019   Encounter for preventive care 02/09/2019   Generalized anxiety disorder 11/14/2008    Ebenezer Mccaskey, PT 07/01/2020, 9:30 AM  [facility] [address] [place], [place], [address] Phone: [phone]   Fax:  [phone]  Name: Patricia Conner MRN: [id number] Date of Birth: [birth year]

## 2020-07-01 NOTE — Telephone Encounter (Signed)
Pt responded with this information:   Thank you for your help.  I have a small concern with my left breast im not sure if i feel thickening if it is normal. Thanks again  [facility] and gave them the information and went ahead and scheduled her an appointment for Monday 08/12/20 at 2:00 PM. Burnett Harry said they do not have a cancellation list but the pt can call and see if they have any cancellations between now and 08/12/20. Replied to pt via portal message with this info. Instructed her to call the office if she has any questions or concerns.

## 2020-07-03 ENCOUNTER — Ambulatory Visit
Admission: RE | Admit: 2020-07-03 | Discharge: 2020-07-03 | Disposition: A | Discharge status: Home / Self Care | Payer: Federal, State, Local not specified - PPO | Source: Ambulatory Visit | Attending: Sports Medicine | Admitting: Sports Medicine

## 2020-07-03 ENCOUNTER — Other Ambulatory Visit: Payer: Self-pay

## 2020-07-03 DIAGNOSIS — M47817 Spondylosis without myelopathy or radiculopathy, lumbosacral region: Secondary | ICD-10-CM | POA: Diagnosis not present

## 2020-07-03 DIAGNOSIS — M47816 Spondylosis without myelopathy or radiculopathy, lumbar region: Secondary | ICD-10-CM

## 2020-07-03 MED ORDER — IOPAMIDOL (ISOVUE-M 200) INJECTION 41%: 1.0000 mL | Freq: Once | INTRAMUSCULAR | Status: DC

## 2020-07-03 MED ORDER — METHYLPREDNISOLONE ACETATE 40 MG/ML INJ SUSP (RADIOLOG: 80.0000 mg | Freq: Once | INTRAMUSCULAR | Status: DC

## 2020-07-03 NOTE — Discharge Instructions (Signed)

## 2020-07-08 ENCOUNTER — Ambulatory Visit: Payer: Federal, State, Local not specified - PPO | Admitting: Physical Therapy

## 2020-07-08 ENCOUNTER — Other Ambulatory Visit: Payer: Self-pay

## 2020-07-08 DIAGNOSIS — M5440 Lumbago with sciatica, unspecified side: Secondary | ICD-10-CM | POA: Diagnosis not present

## 2020-07-08 DIAGNOSIS — R293 Abnormal posture: Secondary | ICD-10-CM

## 2020-07-08 DIAGNOSIS — M6281 Muscle weakness (generalized): Secondary | ICD-10-CM | POA: Diagnosis not present

## 2020-07-08 NOTE — Therapy (Signed)
[facility] [place] [address] [place], [place], [address] Phone: [phone]   Fax:  [phone]  Physical Therapy Treatment  Patient Details  Name: Patricia Conner MRN: [id number] Date of Birth: 06/15/1960 Referring Provider (PT): Rodney Langton, MD   Encounter Date: 07/08/2020   PT End of Session - 07/08/20 0850     Visit Number 2    Number of Visits 12    Date for PT Re-Evaluation 08/12/20    Authorization Type BCBS federal    PT Start Time [phone]    PT Stop Time 0931    PT Time Calculation (min) 42 min             Past Medical History:  Diagnosis Date   A-fib (HCC)    Anxiety     Past Surgical History:  Procedure Laterality Date   ABDOMINAL HYSTERECTOMY     CHOLECYSTECTOMY     FEMUR SURGERY Left    TUBAL LIGATION      There were no vitals filed for this visit.   Subjective Assessment - 07/08/20 0850     Subjective Pt reports she received injection in back on Wednesday. since then her pain has decreased and she is able to sleep more soundly.  She has done the stretches every other day since eval.    Patient Stated Goals To be pain free    Currently in Pain? No/denies    Pain Score 0-No pain                [facility] PT Assessment - 07/08/20 0001       Assessment   Medical Diagnosis lumbar spondylosis    Referring Provider (PT) Rodney Langton, MD    Onset Date/Surgical Date 06/26/20    Hand Dominance Right               [facility] Adult PT Treatment/Exercise - 07/08/20 0001       Self-Care   Self-Care Other Self-Care Comments    Other Self-Care Comments  Pt educated on self massage with ball to lumbar and hip musculature.      Therapeutic Activites    Therapeutic Activities Work [employer]    Work Facilities manager (as pitchfork), pt simulated mucking the stall. cues given for core engagement, knee flexion and hip hinge (vs legs straight and back flexed), and moving feet  towards target vs lumbar rotation with weighted pitchfork. Pt able to return demo of improved form with freq cues.  Lifting 10 and 15# KB (one on each side) from floor to thigh x 2 reps, for simulation of lifting water buckets.  Good form with squat - cues for core engagement.      Lumbar Exercises: Stretches   Hip Flexor Stretch Right;3 reps;Left;2 reps;20 seconds   1 rep in Thomas position; painful LB with RLE off mat   Hip Flexor Stretch Limitations 2nd rep in sitting wiht leg off of chair, and arm overhead- with improved tolerance    Standing Extension 3 reps;5 reps   cues for form, gentle ext to tolerance   Other Lumbar Stretch Exercise L stretch with hands on railing x 2, and with lateral trunk flexion    Other Lumbar Stretch Exercise QL side stretch holding door frame per HEP x 2 reps of 10 sec each .      Lumbar Exercises: Aerobic   Tread Mill up to 1.8 mph x 5 min for warm up.      Lumbar Exercises: Seated  Sit to Stand --   2 reps with core engaged.   Other Seated Lumbar Exercises TA set x 5 sec x 3 reps      Lumbar Exercises: Quadruped   Madcat/Old Horse 5 reps   and wag the tail   Other Quadruped Lumbar Exercises childs pose x 15 sec x 2 reps, then with lateral flexion each direction                    PT Education - 07/08/20 1017     Education Details posture and body mechanics,  added L stretch and hip flexor stretch to HEP .    Person(s) Educated Patient    Methods Explanation;Demonstration;Verbal cues    Comprehension Verbalized understanding;Returned demonstration                 PT Long Term Goals - 07/01/20 0851       PT LONG TERM GOAL #1   Title The patient will be indep with HEP.    Time 6    Period Weeks    Target Date 08/12/20      PT LONG TERM GOAL #2   Title The patient will improve functional status score from 55% up to 71%.    Time 6    Period Weeks    Target Date 08/12/20      PT LONG TERM GOAL #3   Title The patient will  report reduction in muscle spasms at night by 50%.    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    Time 6    Period Weeks    Target Date 08/12/20      PT LONG TERM GOAL #5   Title The patient will report no pain at rest.    Time 6    Period Weeks    Target Date 08/12/20                   Plan - 07/08/20 0931     Clinical Impression Statement Pt required cues for improved body mechanics with work simulated tasks.  She tolerated seated hip flexor stretch vs Thomas position due to pain in Rt LB with RLE off edge of table.  Pt voiced interest in aquatic therapy in future visit; she recently joined [employer]. Progressing towards goals.    Personal Factors and Comorbidities Time since onset of injury/illness/exacerbation    Examination-Activity Limitations Lift;Locomotion Level;Squat;Stairs;Stand    Examination-Participation Restrictions Community Activity;Occupation    Stability/Clinical Decision Making Stable/Uncomplicated    Rehab Potential Good    PT Frequency 2x / week    PT Duration 6 weeks    PT Treatment/Interventions ADLs/Self Care Home Management;Patient/family education;Therapeutic exercise;Therapeutic activities;Manual techniques;Dry needling;Taping;Aquatic Therapy;Electrical Stimulation;Cryotherapy;Moist Heat;Traction;Gait training    PT Next Visit Plan check HEP, DN/STM and self mob for home for QL, progress LE and core strength, work on ankle and hip flexor strength, body mechanics for work    PT Home Exercise Plan Access Code: [email or web address]    Consulted and Agree with Plan of Care Patient             Patient will benefit from skilled therapeutic intervention in order to improve the following deficits and impairments:  Hypomobility, Pain, Impaired flexibility, Postural dysfunction, Decreased range of motion, Decreased strength, Decreased activity  tolerance  Visit Diagnosis: Acute right-sided low back pain with sciatica, sciatica laterality unspecified  Muscle weakness (generalized)  Posture abnormality     Problem List Patient Active Problem List   Diagnosis Date Noted   Lumbar spondylosis 06/26/2020   Positive ANA (antinuclear antibody) 10/26/2019   Osteoarthritis of both hands 10/26/2019   History of atrial fibrillation 02/09/2019   Encounter to establish care 02/09/2019   Encounter for preventive care 02/09/2019   Generalized anxiety disorder 11/14/2008    Mayer Camel, PTA 07/08/20 10:17 AM   [facility] [place] [address] [place] [address] [place], [place], [address] Phone: [phone]   Fax:  [phone]  Name: Kristina Mcnorton MRN: [id number] Date of Birth: 1960/04/30

## 2020-07-08 NOTE — Patient Instructions (Signed)

## 2020-07-11 ENCOUNTER — Other Ambulatory Visit: Payer: Self-pay

## 2020-07-11 ENCOUNTER — Ambulatory Visit: Payer: Federal, State, Local not specified - PPO | Admitting: Rehabilitative and Restorative Service Providers"

## 2020-07-11 DIAGNOSIS — M5440 Lumbago with sciatica, unspecified side: Secondary | ICD-10-CM

## 2020-07-11 DIAGNOSIS — R293 Abnormal posture: Secondary | ICD-10-CM | POA: Diagnosis not present

## 2020-07-11 DIAGNOSIS — M6281 Muscle weakness (generalized): Secondary | ICD-10-CM

## 2020-07-11 NOTE — Therapy (Signed)
[facility] [place] [address] [place] [address] [place], [place], [address] Phone: [phone]   Fax:  [phone]  Physical Therapy Treatment  Patient Details  Name: Patricia Conner MRN: [id number] Date of Birth: Feb 09, 1960 Referring Provider (PT): Rodney Langton, MD   Encounter Date: 07/11/2020   PT End of Session - 07/11/20 0759     Visit Number 3    Number of Visits 12    Date for PT Re-Evaluation 08/12/20    Authorization Type BCBS federal    PT Start Time [phone]    PT Stop Time 0840    PT Time Calculation (min) 41 min    Activity Tolerance Patient tolerated treatment well    Behavior During Therapy [facility] for tasks assessed/performed             Past Medical History:  Diagnosis Date   A-fib (HCC)    Anxiety     Past Surgical History:  Procedure Laterality Date   ABDOMINAL HYSTERECTOMY     CHOLECYSTECTOMY     FEMUR SURGERY Left    TUBAL LIGATION      There were no vitals filed for this visit.   Subjective Assessment - 07/11/20 0800     Subjective The patient reports that she woke up in the middle of the night with significant muscle cramping in both legs.  She also has cramping in trunk musculature.    Patient Stated Goals To be pain free    Currently in Pain? Yes    Pain Score 6     Pain Location Back    Pain Orientation Right;Lower    Pain Descriptors / Indicators Discomfort    Pain Type Chronic pain    Pain Onset More than a month ago    Pain Frequency Intermittent    Aggravating Factors  walking, inclines    Pain Relieving Factors sitting                [facility] PT Assessment - 07/11/20 0802       Assessment   Medical Diagnosis lumbar spondylosis    Referring Provider (PT) Rodney Langton, MD    Onset Date/Surgical Date 06/26/20    Hand Dominance Right                           [facility] Adult PT Treatment/Exercise - 07/11/20 0808       Exercises   Exercises Lumbar       Lumbar Exercises: Stretches   Lower Trunk Rotation 5 reps;10 seconds    Lower Trunk Rotation Limitations with towel roll under low t-spine perpendicular for thoracic mobilization    Gastroc Stretch Right;Left;2 reps;30 seconds    Other Lumbar Stretch Exercise supine thoracic mobilization on towel roll parallel to spine for chest opening and mobility      Lumbar Exercises: Sidelying   Hip Abduction 10 reps    Other Sidelying Lumbar Exercises hip hike and depression with ball under R foot (L sidelying)      Modalities   Modalities Electrical Stimulation;Moist Heat      Moist Heat Therapy   Number Minutes Moist Heat 12 Minutes    Moist Heat Location Lumbar Spine      Electrical Stimulation   Electrical Stimulation Location thoracic-lumbar spine    Electrical Stimulation Action interferential    Electrical Stimulation Parameters to tolerance    Electrical Stimulation Goals Pain;Tone      Manual Therapy   Manual Therapy  Joint mobilization;Soft tissue mobilization    Manual therapy comments to reduce pain and improve mobility;  skilled palpation to assess response to STM and DN    Joint Mobilization CPA and lateral glides ow t-spine to L-spine    Soft tissue mobilization lumbar and thoracic paraspinals              Trigger Point Dry Needling - 07/11/20 9767     Consent Given? Yes    Education Handout Provided Yes    Muscles Treated Back/Hip Lumbar multifidi;Thoracic multifidi    Lumbar multifidi Response Twitch response elicited    Thoracic multifidi response Twitch response elicited                       PT Long Term Goals - 07/01/20 0851       PT LONG TERM GOAL #1   Title The patient will be indep with HEP.    Time 6    Period Weeks    Target Date 08/12/20      PT LONG TERM GOAL #2   Title The patient will improve functional status score from 55% up to 71%.    Time 6    Period Weeks    Target Date 08/12/20      PT LONG TERM GOAL #3   Title The  patient will report reduction in muscle spasms at night by 50%.    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    Time 6    Period Weeks    Target Date 08/12/20      PT LONG TERM GOAL #5   Title The patient will report no pain at rest.    Time 6    Period Weeks    Target Date 08/12/20                   Plan - 07/11/20 1326     Clinical Impression Statement The patient's pain level reduced with therapy today. PT continuing to progress to LTGs to patient tolerance.  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    Personal Factors and Comorbidities --    Examination-Activity Limitations --    Examination-Participation Restrictions --    Stability/Clinical Decision Making --    Rehab Potential --    PT Frequency 2x / week    PT Duration 6 weeks    PT Treatment/Interventions ADLs/Self Care Home Management;Patient/family education;Therapeutic exercise;Therapeutic activities;Manual techniques;Dry needling;Taping;Aquatic Therapy;Electrical Stimulation;Cryotherapy;Moist Heat;Traction;Gait training    PT Next Visit Plan check HEP, DN/STM and self mob for home for QL, progress LE and core strength, work on ankle and hip flexor strength, body mechanics for work    PT Home Exercise Plan Access Code: [email or web address]    Consulted and Agree with Plan of Care Patient             Patient will benefit from skilled therapeutic intervention in order to improve the following deficits and impairments:     Visit Diagnosis: Acute right-sided low back pain with sciatica, sciatica laterality unspecified  Muscle weakness (generalized)  Posture abnormality     Problem List Patient Active Problem List   Diagnosis Date Noted   Lumbar spondylosis 06/26/2020   Positive ANA (antinuclear antibody) 10/26/2019   Osteoarthritis of both hands 10/26/2019  History of atrial fibrillation 02/09/2019   Encounter to establish care 02/09/2019   Encounter for preventive care 02/09/2019   Generalized anxiety disorder 11/14/2008    Arash Karstens, PT 07/11/2020, 1:40 PM  [facility] [address] [place], [place], [address] Phone: [phone]   Fax:  [phone]  Name: Patricia Conner MRN: [id number] Date of Birth: [birth year]

## 2020-07-16 ENCOUNTER — Other Ambulatory Visit: Payer: Self-pay

## 2020-07-16 ENCOUNTER — Ambulatory Visit (INDEPENDENT_AMBULATORY_CARE_PROVIDER_SITE_OTHER): Payer: Federal, State, Local not specified - PPO | Admitting: Rehabilitative and Restorative Service Providers"

## 2020-07-16 DIAGNOSIS — M6281 Muscle weakness (generalized): Secondary | ICD-10-CM

## 2020-07-16 DIAGNOSIS — R293 Abnormal posture: Secondary | ICD-10-CM

## 2020-07-16 DIAGNOSIS — M5440 Lumbago with sciatica, unspecified side: Secondary | ICD-10-CM

## 2020-07-16 NOTE — Patient Instructions (Signed)
   Aquatic Therapy: What to Expect!  Where:  [facility] [address] [place], [place] [address] [phone]           How to Prepare: Please make sure you drink 8 ounces of water about one hour prior to your pool session A caregiver must attend the entire session with the patient (unless your primary therapists feels this is not necessary). The caregiver will be responsible for assisting with dressing as well as any toileting needs.  Please arrive IN YOUR SUIT and a few minutes prior to your appointment - this helps to avoid delays in starting your session. Please make sure to attend to any toileting needs prior to entering the pool. Once on the pool deck your therapist will ask you to sign the Patient  Consent and Assignment of Benefits form. Your therapist may take your blood pressure prior to, during and after your session if indicated. We usually try and create a home exercise program based on activities we do in the pool.  Please be thinking about who might be able to assist you in the pool should you want to participate in an aquatic home exercise program at the time of discharge.  Some patients do not want to or do not have the ability to participate in an aquatic home program - this is not a barrier in any way to you participating in aquatic therapy as part of your current therapy plan!  Appointments:  All sessions are 45 minutes  About the pool: Entering the pool Your therapist will assist you; there are two ways to enter the pool - stairs or a mechanical lift. Your therapist will determine the most appropriate way for you. Water temperature is usually around 90-91.  There is a lap pool with a temperature around 84 There may be other swimmers in the pool at the same time.   Contact Info:             To cancel appointment, please call [facility] at [phone] If you are running late, please call [facility] at  [phone]                   

## 2020-07-16 NOTE — Therapy (Signed)
[facility] [place] [address] [place], [place], [address] Phone: [phone]   Fax:  [phone]  Physical Therapy Treatment  Patient Details  Name: Albert Devaul MRN: [id number] Date of Birth: [birth year] Referring Provider (PT): Rodney Langton, MD   Encounter Date: 07/16/2020   PT End of Session - 07/16/20 0850     Visit Number 4    Number of Visits 12    Date for PT Re-Evaluation 08/12/20    Authorization Type BCBS federal    PT Start Time [phone]    PT Stop Time 0927    PT Time Calculation (min) 40 min    Activity Tolerance Patient tolerated treatment well    Behavior During Therapy [facility] for tasks assessed/performed             Past Medical History:  Diagnosis Date   A-fib (HCC)    Anxiety     Past Surgical History:  Procedure Laterality Date   ABDOMINAL HYSTERECTOMY     CHOLECYSTECTOMY     FEMUR SURGERY Left    TUBAL LIGATION      There were no vitals filed for this visit.   Subjective Assessment - 07/16/20 0849     Subjective The patient reports the DN was painful but helped relieve some tightness.  She had some cramping and spasms in her legs yesterday.    Patient Stated Goals To be pain free    Currently in Pain? No/denies    Pain Onset More than a month ago    Pain Frequency Intermittent                [facility] PT Assessment - 07/16/20 0851       Assessment   Medical Diagnosis lumbar spondylosis    Referring Provider (PT) Rodney Langton, MD    Onset Date/Surgical Date 06/26/20    Hand Dominance Right                           [facility] Adult PT Treatment/Exercise - 07/16/20 0852       Exercises   Exercises Lumbar      Lumbar Exercises: Stretches   Lower Trunk Rotation 3 reps;10 seconds    Other Lumbar Stretch Exercise foam rolling low back, glut med,    Other Lumbar Stretch Exercise foam rolling hamstrings; thoracic lumbar junction extension over pool noodle  with trunk rotation      Lumbar Exercises: Aerobic   Nustep L5 x 4 minutes LEs only      Lumbar Exercises: Quadruped   Madcat/Old Horse 5 reps    Straight Leg Raise 10 reps    Other Quadruped Lumbar Exercises childs pose x 15 sec x 2 reps, then with lateral flexion each direction      Manual Therapy   Manual Therapy Joint mobilization;Soft tissue mobilization    Manual therapy comments to reduce pain and improve mobility    Joint Mobilization CPA low thoracic grade I-II    Soft tissue mobilization R QL, lumbar paraspinals and intercostal STM                         PT Long Term Goals - 07/01/20 0851       PT LONG TERM GOAL #1   Title The patient will be indep with HEP.    Time 6    Period Weeks    Target Date 08/12/20  PT LONG TERM GOAL #2   Title The patient will improve functional status score from 55% up to 71%.    Time 6    Period Weeks    Target Date 08/12/20      PT LONG TERM GOAL #3   Title The patient will report reduction in muscle spasms at night by 50%.    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    Time 6    Period Weeks    Target Date 08/12/20      PT LONG TERM GOAL #5   Title The patient will report no pain at rest.    Time 6    Period Weeks    Target Date 08/12/20                   Plan - 07/16/20 1254     Clinical Impression Statement The patient has reduced tightness noted today.  PT was able to progress to add some strengthening activities for HEP.  Plan to continue to progress tolerance.    PT Treatment/Interventions ADLs/Self Care Home Management;Patient/family education;Therapeutic exercise;Therapeutic activities;Manual techniques;Dry needling;Taping;Aquatic Therapy;Electrical Stimulation;Cryotherapy;Moist Heat;Traction;Gait training    PT Next Visit Plan check HEP, DN/STM and self mob for home  for QL, progress LE and core strength, work on ankle and hip flexor strength, body mechanics for work    PT Home Exercise Plan Access Code: [email or web address]    Consulted and Agree with Plan of Care Patient             Patient will benefit from skilled therapeutic intervention in order to improve the following deficits and impairments:     Visit Diagnosis: Acute right-sided low back pain with sciatica, sciatica laterality unspecified  Muscle weakness (generalized)  Posture abnormality     Problem List Patient Active Problem List   Diagnosis Date Noted   Lumbar spondylosis 06/26/2020   Positive ANA (antinuclear antibody) 10/26/2019   Osteoarthritis of both hands 10/26/2019   History of atrial fibrillation 02/09/2019   Encounter to establish care 02/09/2019   Encounter for preventive care 02/09/2019   Generalized anxiety disorder 11/14/2008    Bobbette Eakes, PT 07/16/2020, 12:57 PM  [facility] [place] [address] [place] [address] [place], [place], [address] Phone: [phone]   Fax:  [phone]  Name: Mersadies Petree MRN: [id number] Date of Birth: May 26, 1960

## 2020-07-19 ENCOUNTER — Ambulatory Visit (INDEPENDENT_AMBULATORY_CARE_PROVIDER_SITE_OTHER): Payer: Federal, State, Local not specified - PPO | Admitting: Physical Therapy

## 2020-07-19 ENCOUNTER — Other Ambulatory Visit: Payer: Self-pay

## 2020-07-19 DIAGNOSIS — M5440 Lumbago with sciatica, unspecified side: Secondary | ICD-10-CM | POA: Diagnosis not present

## 2020-07-19 DIAGNOSIS — M6281 Muscle weakness (generalized): Secondary | ICD-10-CM

## 2020-07-19 DIAGNOSIS — R293 Abnormal posture: Secondary | ICD-10-CM

## 2020-07-19 NOTE — Therapy (Signed)
[facility] [place] [address] [place] [address] [place], [place], [address] Phone: [phone]   Fax:  [phone]  Physical Therapy Treatment  Patient Details  Name: Patricia Conner MRN: [id number] Date of Birth: 1961/01/12 Referring Provider (PT): Rodney Langton, MD   Encounter Date: 07/19/2020   PT End of Session - 07/19/20 1449     Visit Number 5    Number of Visits 12    Date for PT Re-Evaluation 08/12/20    Authorization Type BCBS federal    PT Start Time 1443    PT Stop Time 1524    PT Time Calculation (min) 41 min    Activity Tolerance Patient tolerated treatment well    Behavior During Therapy [facility] for tasks assessed/performed             Past Medical History:  Diagnosis Date   A-fib (HCC)    Anxiety     Past Surgical History:  Procedure Laterality Date   ABDOMINAL HYSTERECTOMY     CHOLECYSTECTOMY     FEMUR SURGERY Left    TUBAL LIGATION      There were no vitals filed for this visit.   Subjective Assessment - 07/19/20 1445     Subjective Pt went to pool and swam yesterday, "I may have overdone it".  She reports spasming in Rt flank and low back.    Currently in Pain? Yes    Pain Score 5     Pain Location Back    Pain Orientation Right;Lateral;Lower    Pain Descriptors / Indicators Spasm;Dull;Aching    Aggravating Factors  walking, inclines    Pain Relieving Factors sitting, medicine            Pt seen for aquatic therapy today.  Treatment took place in water 3.25-4 ft in depth at the [facility] pool. Temp of water was 91.  Pt entered/exited the pool via stairs independently with single rail.  Treatment:   Gait without floatation assistance:  forward, backward, sidestepping.  Side step squat R <>L. Hurdle walk single leg, and step forward.   Holding onto side of pool:  Heel/toe raises x 12;  hip abdct x 10 each leg; hip ext x 10 each leg.  Hip circles wth knee flexed CW x 5/ CCW x 5. Rt  side stretch, reaching overhead x 10 sec x 4 reps.   Trial of side stroke to assess form - limited tolerance for Lt side, due to shortening on Rt side.   Trial of modified pigeon pose - unable to complete, limited ROM in hips.    Standing hamstring stretch holding onto stair rails, foot on 3rd step, holding 20 sec x 2 reps each leg.   Ai Chi postures: Enclosing, soothing, gathering, freeing x 5-10 of each.    Pt requires buoyancy for support and to offload joints with strengthening exercises. Viscosity of the water is needed for resistance of strengthening; water current perturbations provides challenge to standing balance unsupported, requiring increased core activation.     PT Long Term Goals - 07/01/20 5035       PT LONG TERM GOAL #1   Title The patient will be indep with HEP.    Time 6    Period Weeks    Target Date 08/12/20      PT LONG TERM GOAL #2   Title The patient will improve functional status score from 55% up to 71%.    Time 6    Period Weeks  Target Date 08/12/20      PT LONG TERM GOAL #3   Title The patient will report reduction in muscle spasms at night by 50%.    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    Time 6    Period Weeks    Target Date 08/12/20      PT LONG TERM GOAL #5   Title The patient will report no pain at rest.    Time 6    Period Weeks    Target Date 08/12/20                   Plan - 07/19/20 1536     Clinical Impression Statement Positive response to [place] applied last visit.  Pt reported reduction of Rt lower back and flank pain while in pool and upon completion of session, when standing on deck.  Pt's balance was challenged with Ai Chi postures.  Lt side stroke not tolerated; encouraged pt to avoid this stroke when at pool for now.  Progressing towards goals.    Rehab Potential Good    PT Frequency 2x  / week    PT Duration 6 weeks    PT Treatment/Interventions ADLs/Self Care Home Management;Patient/family education;Therapeutic exercise;Therapeutic activities;Manual techniques;Dry needling;Taping;Aquatic Therapy;Electrical Stimulation;Cryotherapy;Moist Heat;Traction;Gait training    PT Next Visit Plan check HEP, DN/STM and self mob for home for QL, progress LE and core strength, work on ankle and hip flexor strength, body mechanics for work    PT Home Exercise Plan Access Code: [email or web address]    Consulted and Agree with Plan of Care Patient             Patient will benefit from skilled therapeutic intervention in order to improve the following deficits and impairments:  Hypomobility, Pain, Impaired flexibility, Postural dysfunction, Decreased range of motion, Decreased strength, Decreased activity tolerance  Visit Diagnosis: Acute right-sided low back pain with sciatica, sciatica laterality unspecified  Muscle weakness (generalized)  Posture abnormality     Problem List Patient Active Problem List   Diagnosis Date Noted   Lumbar spondylosis 06/26/2020   Positive ANA (antinuclear antibody) 10/26/2019   Osteoarthritis of both hands 10/26/2019   History of atrial fibrillation 02/09/2019   Encounter to establish care 02/09/2019   Encounter for preventive care 02/09/2019   Generalized anxiety disorder 11/14/2008   Mayer Camel, PTA 07/19/20 3:41 PM  [facility] [place] [address] [place] [address] [place], [place], [address] Phone: [phone]   Fax:  [phone]  Name: Patricia Conner MRN: [id number] Date of Birth: 11-17-1960

## 2020-07-22 ENCOUNTER — Ambulatory Visit: Payer: Federal, State, Local not specified - PPO | Admitting: Rehabilitative and Restorative Service Providers"

## 2020-07-22 ENCOUNTER — Encounter: Payer: Self-pay | Admitting: Rehabilitative and Restorative Service Providers"

## 2020-07-22 ENCOUNTER — Other Ambulatory Visit: Payer: Self-pay

## 2020-07-22 DIAGNOSIS — M6281 Muscle weakness (generalized): Secondary | ICD-10-CM | POA: Diagnosis not present

## 2020-07-22 DIAGNOSIS — R293 Abnormal posture: Secondary | ICD-10-CM | POA: Diagnosis not present

## 2020-07-22 DIAGNOSIS — M5440 Lumbago with sciatica, unspecified side: Secondary | ICD-10-CM

## 2020-07-22 NOTE — Therapy (Signed)
[facility] [place] [address] [place] [address] [place], [place], [address] Phone: [phone]   Fax:  [phone]  Physical Therapy Treatment  Patient Details  Name: Patricia Conner MRN: [id number] Date of Birth: [birth year] Referring Provider (PT): Rodney Langton, MD   Encounter Date: 07/22/2020   PT End of Session - 07/22/20 0810     Visit Number 6    Number of Visits 12    Date for PT Re-Evaluation 08/12/20    Authorization Type BCBS federal    PT Start Time 0801    PT Stop Time 0845    PT Time Calculation (min) 44 min    Activity Tolerance Patient tolerated treatment well    Behavior During Therapy [facility] for tasks assessed/performed             Past Medical History:  Diagnosis Date   A-fib [facility])    Anxiety     Past Surgical History:  Procedure Laterality Date   ABDOMINAL HYSTERECTOMY     CHOLECYSTECTOMY     FEMUR SURGERY Left    TUBAL LIGATION      There were no vitals filed for this visit.   Subjective Assessment - 07/22/20 0803     Subjective The patient has 2/10 pain today noting improvement.  She was getting spasming in the right leg on Friday morning, but it has eased up since then.  She does note spasming along the trunk and into the legs frequently int he morning.    Patient Stated Goals To be pain free    Currently in Pain? Yes    Pain Score 2     Pain Location Back    Pain Orientation Right;Lower;Lateral    Pain Descriptors / Indicators Spasm;Aching    Pain Type Chronic pain    Pain Onset More than a month ago    Pain Frequency Intermittent    Aggravating Factors  walking, inclines    Pain Relieving Factors sitting, medicine                [facility] PT Assessment - 07/22/20 0813       Assessment   Medical Diagnosis lumbar spondylosis    Referring Provider (PT) Rodney Langton, MD    Onset Date/Surgical Date 06/26/20    Hand Dominance Right                           [facility]  Adult PT Treatment/Exercise - 07/22/20 0814       Exercises   Exercises Lumbar      Lumbar Exercises: Stretches   Hip Flexor Stretch Right;Left;1 rep;60 seconds    Other Lumbar Stretch Exercise trunk rotation/spinal twist supine with overpressure    Other Lumbar Stretch Exercise side sitting for quadratus lumborum stretch initially on elbow then extending arm      Lumbar Exercises: Aerobic   Tread Mill x 4.5 minutes 2.5 mph at 0% then 2% incline      Lumbar Exercises: Standing   Functional Squats 10 reps    Functional Squats Limitations reaching ot handle of kettle bell    Other Standing Lumbar Exercises 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 Yes    Education Handout Provided Previously provided    Muscles Treated Back/Hip Thoracic multifidi;Lumbar multifidi    Lumbar multifidi Response Palpable increased muscle length    Thoracic multifidi response Palpable increased muscle length                       PT Long Term Goals - 07/22/20 0810       PT LONG TERM GOAL #1   Title The patient will be indep with HEP.    Time 6    Period Weeks    Status On-going      PT LONG TERM GOAL #2   Title The patient will improve functional status score from 55% up to 71%.    Time 6    Period Weeks    Status On-going      PT LONG TERM GOAL #3   Title  The patient will report reduction in muscle spasms at night by 50%.    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    Time 6    Period Weeks    Status On-going      PT LONG TERM GOAL #5   Title The patient will report no pain at rest.    Time 6    Period Weeks    Status On-going                   Plan - 07/22/20 0825     Clinical Impression Statement The patient is reducing pain in low back.  She recently joined the [facility] to access the water.  PT continuing to progress strengthening and stabilization to patient tolerance.    PT Treatment/Interventions ADLs/Self Care Home Management;Patient/family education;Therapeutic exercise;Therapeutic activities;Manual techniques;Dry needling;Taping;Aquatic Therapy;Electrical Stimulation;Cryotherapy;Moist Heat;Traction;Gait training    PT Next Visit Plan check HEP, DN/STM and self mob for home for QL, progress LE and core strength, work on ankle and hip flexor strength, body mechanics for work    PT Home Exercise Plan Access Code: [email or web address]    Consulted and Agree with Plan of Care Patient             Patient will benefit from skilled therapeutic intervention in order to improve the following deficits and impairments:     Visit Diagnosis: Acute right-sided low back pain with sciatica, sciatica laterality unspecified  Muscle weakness (generalized)  Posture abnormality     Problem List Patient Active Problem List   Diagnosis Date Noted   Lumbar spondylosis 06/26/2020   Positive ANA (antinuclear antibody) 10/26/2019   Osteoarthritis of both hands 10/26/2019   History of atrial fibrillation 02/09/2019   Encounter to establish care 02/09/2019   Encounter for preventive care 02/09/2019   Generalized anxiety disorder 11/14/2008  Patricia Conner 07/22/2020, 3:48 PM  [facility] [address] [place] [address] [place], [place], [address] Phone: [phone]   Fax:  [phone]  Name: Patricia Conner MRN: [id number] Date of Birth: 07/07/1960

## 2020-07-25 ENCOUNTER — Encounter: Payer: Federal, State, Local not specified - PPO | Admitting: Rehabilitative and Restorative Service Providers"

## 2020-07-26 ENCOUNTER — Ambulatory Visit (INDEPENDENT_AMBULATORY_CARE_PROVIDER_SITE_OTHER): Payer: Federal, State, Local not specified - PPO | Admitting: Physical Therapy

## 2020-07-26 ENCOUNTER — Encounter: Payer: Self-pay | Admitting: Physical Therapy

## 2020-07-26 ENCOUNTER — Other Ambulatory Visit: Payer: Self-pay

## 2020-07-26 DIAGNOSIS — R293 Abnormal posture: Secondary | ICD-10-CM

## 2020-07-26 DIAGNOSIS — M6281 Muscle weakness (generalized): Secondary | ICD-10-CM | POA: Diagnosis not present

## 2020-07-26 DIAGNOSIS — M5440 Lumbago with sciatica, unspecified side: Secondary | ICD-10-CM | POA: Diagnosis not present

## 2020-07-26 NOTE — Therapy (Signed)
[facility] [place] [address] [place] [address] [place], [place], [address] Phone: [phone]   Fax:  [phone]  Physical Therapy Treatment  Patient Details  Name: Patricia Conner MRN: [id number] Date of Birth: 1960-12-09 Referring Provider (PT): Rodney Langton, MD   Encounter Date: 07/26/2020   PT End of Session - 07/26/20 1200     Visit Number 7    Number of Visits 12    Date for PT Re-Evaluation 08/12/20    Authorization Type BCBS federal    PT Start Time 1145    PT Stop Time 1230    PT Time Calculation (min) 45 min    Activity Tolerance Patient tolerated treatment well    Behavior During Therapy [facility] for tasks assessed/performed             Past Medical History:  Diagnosis Date   A-fib [facility])    Anxiety     Past Surgical History:  Procedure Laterality Date   ABDOMINAL HYSTERECTOMY     CHOLECYSTECTOMY     FEMUR SURGERY Left    TUBAL LIGATION      There were no vitals filed for this visit.   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 "  She had to carry a full bucket of water (40#) "and my back felt it too".  She reports that walking on treadmill is becoming less painful and her endurance is improving.    Currently in Pain? Yes    Pain Score 2     Pain Location Hip    Pain Orientation Right;Posterior;Proximal    Pain Descriptors / Indicators Sore             Pt seen for aquatic therapy today.  Treatment took place in water 3.25-4 ft in depth at the [facility] pool. Temp of water was 91.  Pt entered/exited the pool via stairs independently with single rail.  Treatment:    Gait without floatation/ UE support:  forward, backward, side stepping.  Braiding with light torso rotation.   Side step squat without/ with arm abdct/add.  High knee marching with reciprocal arm, core activated.   High knee step overs / step back x 10 each leg. (To  simulate getting on/off horse).  D1 flexion LE x 10 each side.   20 heel raises without UE support. Wide side to side lunges for hip flexibility.   Ai Chi postures with small paddles to increase resistance:  Enclosing and soothing x 5 reps each side.  Without paddles: gathering x 8 reps each side (challenging on balance).   Warrior 1 position holding noodle in rainbow pattern up overhead and to the water x 5 each side (difficult attaining position with back leg due to limited hip rotation).    Pt requires buoyancy for support and to offload joints with strengthening exercises. Viscosity of the water is needed for resistance of strengthening; water current perturbations provides challenge to standing balance unsupported, requiring increased core activation.     PT Long Term Goals - 07/22/20 0810       PT LONG TERM GOAL #1   Title The patient will be indep with HEP.    Time 6    Period Weeks    Status On-going      PT LONG TERM GOAL #2   Title The patient will improve functional status score from 55% up to 71%.    Time 6  Period Weeks    Status On-going      PT LONG TERM GOAL #3   Title The patient will report reduction in muscle spasms at night by 50%.    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    Time 6    Period Weeks    Status On-going      PT LONG TERM GOAL #5   Title The patient will report no pain at rest.    Time 6    Period Weeks    Status On-going                   Plan - 07/26/20 1200     Clinical Impression Statement Good response to exercises and treatment thus far; pt reporting less pain and more endurance for functional tasks.  She reported fatigue in Rt hip with single leg exercises in pool today.  Her balance is challenged with lumbar/thoracic rotation in wide  staggered stance; also has limited rotation Rt. . She reports pain in Rt SI area with end range lumbar rotation in Rt wide staggered stance. When at rest in the pool, pt's pain was 0/10.  Progressing well towards LTGs.    Rehab Potential Good    PT Frequency 2x / week    PT Duration 6 weeks    PT Treatment/Interventions ADLs/Self Care Home Management;Patient/family education;Therapeutic exercise;Therapeutic activities;Manual techniques;Dry needling;Taping;Aquatic Therapy;Electrical Stimulation;Cryotherapy;Moist Heat;Traction;Gait training    PT Next Visit Plan establish aquatic HEP routine.  DN/STM for Rt hip/LB, progress LE and core strength, work on ankle and hip flexor strength, body mechanics for work    PT Home Exercise Plan Access Code: [email or web address]    Consulted and Agree with Plan of Care Patient             Patient will benefit from skilled therapeutic intervention in order to improve the following deficits and impairments:  Hypomobility, Pain, Impaired flexibility, Postural dysfunction, Decreased range of motion, Decreased strength, Decreased activity tolerance  Visit Diagnosis: Acute right-sided low back pain with sciatica, sciatica laterality unspecified  Muscle weakness (generalized)  Posture abnormality     Problem List Patient Active Problem List   Diagnosis Date Noted   Lumbar spondylosis 06/26/2020   Positive ANA (antinuclear antibody) 10/26/2019   Osteoarthritis of both hands 10/26/2019   History of atrial fibrillation 02/09/2019   Encounter to establish care 02/09/2019   Encounter for preventive care 02/09/2019   Generalized anxiety disorder 11/14/2008   Mayer Camel, PTA 07/26/20 12:41 PM  [facility] [place] [address] [place] [address] [place], [place], [address] Phone: [phone]   Fax:  [phone]  Name: Patricia Conner MRN: [id number] Date of Birth: 1960/07/10

## 2020-07-29 ENCOUNTER — Ambulatory Visit: Payer: Federal, State, Local not specified - PPO | Admitting: Rehabilitative and Restorative Service Providers"

## 2020-07-29 ENCOUNTER — Other Ambulatory Visit: Payer: Self-pay

## 2020-07-29 DIAGNOSIS — M6281 Muscle weakness (generalized): Secondary | ICD-10-CM | POA: Diagnosis not present

## 2020-07-29 DIAGNOSIS — R293 Abnormal posture: Secondary | ICD-10-CM

## 2020-07-29 DIAGNOSIS — M5440 Lumbago with sciatica, unspecified side: Secondary | ICD-10-CM | POA: Diagnosis not present

## 2020-07-29 NOTE — Therapy (Signed)
[facility] [place] [address] [place] [address] [place], [place], [address] Phone: [phone]   Fax:  [phone]  Physical Therapy Treatment  Patient Details  Name: Patricia Conner MRN: [id number] Date of Birth: Sep 10, 1960 Referring Provider (PT): Rodney Langton, MD   Encounter Date: 07/29/2020   PT End of Session - 07/29/20 0805     Visit Number 8    Number of Visits 12    Date for PT Re-Evaluation 08/12/20    Authorization Type BCBS federal    PT Start Time 0801    PT Stop Time 0843    PT Time Calculation (min) 42 min    Activity Tolerance Patient tolerated treatment well    Behavior During Therapy [facility] for tasks assessed/performed             Past Medical History:  Diagnosis Date   A-fib ([facility])    Anxiety     Past Surgical History:  Procedure Laterality Date   ABDOMINAL HYSTERECTOMY     CHOLECYSTECTOMY     FEMUR SURGERY Left    TUBAL LIGATION      There were no vitals filed for this visit.   Subjective Assessment - 07/29/20 0803     Subjective The patient reports she is feeling better overall.  She continues with tightness in R QL region.  She continues getting soreness after carrying buckets.  She is walking 30 minutes / day 3 times/week on the treadmill.  The patient reports a type of "bear hug" spasm in her abdomen, back and trunk.  These occur in the morning, multiple days/week.    Patient Stated Goals To be pain free    Currently in Pain? Yes    Pain Score 1     Pain Location Back    Pain Descriptors / Indicators Tightness    Pain Type Chronic pain    Pain Radiating Towards R lateral thigh also feels tight/sore    Pain Onset More than a month ago    Pain Frequency Intermittent    Aggravating Factors  walking, inclines, carrying buckets at the barn    Pain Relieving Factors sitting, medicine                [facility] PT Assessment - 07/29/20 0809       Assessment   Medical Diagnosis lumbar spondylosis     Referring Provider (PT) Rodney Langton, MD    Onset Date/Surgical Date 06/26/20    Hand Dominance Right                           [facility] Adult PT Treatment/Exercise - 07/29/20 0810       Self-Care   Self-Care Other Self-Care Comments    Other Self-Care Comments  patient educated in self massage IT Band-- foam roller too painful, used muscle roller (pastry roller for home)      Exercises   Exercises Lumbar      Lumbar Exercises: Stretches   Double Knee to Chest Stretch 1 rep;30 seconds    Lower Trunk Rotation 2 reps;30 seconds    Other Lumbar Stretch Exercise side sitting with lateral trunk lengthening    Other Lumbar Stretch Exercise foam rolling lateral quad/IT Band      Lumbar Exercises: Aerobic   Tread Mill x 5 minutes 2.2 mph without UE support      Lumbar Exercises: Supine   Isometric Hip Flexion 5 reps;5 seconds    Isometric Hip Flexion  Limitations with legs supported on physioball    Other Supine Lumbar Exercises supine physioball rotation with some oblique activation      Lumbar Exercises: Sidelying   Other Sidelying Lumbar Exercises modified side plank x 6 reps x 5 second holds      Manual Therapy   Manual Therapy Joint mobilization;Soft tissue mobilization;Myofascial release              Trigger Point Dry Needling - 07/29/20 0001     Consent Given? Yes    Education Handout Provided Previously provided    Muscles Treated Back/Hip Thoracic multifidi;Lumbar multifidi;Erector spinae;Quadratus lumborum    Erector spinae Response Twitch response elicited;Palpable increased muscle length    Lumbar multifidi Response Palpable increased muscle length    Quadratus Lumborum Response Palpable increased muscle length    Thoracic multifidi response Twitch response elicited;Palpable increased muscle length                       PT Long Term Goals - 07/22/20 0810       PT LONG TERM GOAL #1   Title The patient will be indep with  HEP.    Time 6    Period Weeks    Status On-going      PT LONG TERM GOAL #2   Title The patient will improve functional status score from 55% up to 71%.    Time 6    Period Weeks    Status On-going      PT LONG TERM GOAL #3   Title The patient will report reduction in muscle spasms at night by 50%.    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    Time 6    Period Weeks    Status On-going      PT LONG TERM GOAL #5   Title The patient will report no pain at rest.    Time 6    Period Weeks    Status On-going                   Plan - 07/29/20 0809     Clinical Impression Statement The patient continues with tightness in R QL region.  She has tenderness in thoracic and lumbar paraspinals and responds well to DN/STM and ther ex.  PT to continue to progress to patient tolerance.    PT Treatment/Interventions ADLs/Self Care Home Management;Patient/family education;Therapeutic exercise;Therapeutic activities;Manual techniques;Dry needling;Taping;Aquatic Therapy;Electrical Stimulation;Cryotherapy;Moist Heat;Traction;Gait training    PT Next Visit Plan establish aquatic HEP routine.  DN/STM for Rt hip/LB, progress LE and core strength, work on ankle and hip flexor strength, body mechanics for work    PT Home Exercise Plan Access Code: [email or web address]    Consulted and Agree with Plan of Care Patient             Patient will benefit from skilled therapeutic intervention in order to improve the following deficits and impairments:     Visit Diagnosis: Acute right-sided low back pain with sciatica, sciatica laterality unspecified  Muscle weakness (generalized)  Posture abnormality     Problem List Patient Active Problem List   Diagnosis Date Noted   Lumbar spondylosis 06/26/2020   Positive  ANA (antinuclear antibody)  10/26/2019   Osteoarthritis of both hands 10/26/2019   History of atrial fibrillation 02/09/2019   Encounter to establish care 02/09/2019   Encounter for preventive care 02/09/2019   Generalized anxiety disorder 11/14/2008    Damareon Lanni, PT 07/29/2020, 2:48 PM  [facility] [address] [place], [place], [address] Phone: [phone]   Fax:  [phone]  Name: Patricia Conner MRN: [id number] Date of Birth: 12/28/1960

## 2020-07-30 ENCOUNTER — Ambulatory Visit: Payer: Federal, State, Local not specified - PPO | Admitting: Rehabilitative and Restorative Service Providers"

## 2020-07-30 ENCOUNTER — Encounter: Payer: Self-pay | Admitting: Rehabilitative and Restorative Service Providers"

## 2020-07-30 DIAGNOSIS — M6281 Muscle weakness (generalized): Secondary | ICD-10-CM

## 2020-07-30 DIAGNOSIS — R293 Abnormal posture: Secondary | ICD-10-CM

## 2020-07-30 DIAGNOSIS — M5440 Lumbago with sciatica, unspecified side: Secondary | ICD-10-CM | POA: Diagnosis not present

## 2020-07-30 NOTE — Therapy (Signed)
[facility] [place] [address] [place], [place], [address] Phone: [phone]   Fax:  [phone]  Physical Therapy Treatment  Patient Details  Name: Patricia Conner MRN: [id number] Date of Birth: 02/14/1960 Referring Provider (PT): Rodney Langton, MD   Encounter Date: 07/30/2020   PT End of Session - 07/30/20 1152     Visit Number 9    Number of Visits 12    Date for PT Re-Evaluation 08/12/20    Authorization Type BCBS federal    PT Start Time 1145    PT Stop Time 1236    PT Time Calculation (min) 51 min    Activity Tolerance Patient tolerated treatment well    Behavior During Therapy [facility] for tasks assessed/performed             Past Medical History:  Diagnosis Date   A-fib (HCC)    Anxiety     Past Surgical History:  Procedure Laterality Date   ABDOMINAL HYSTERECTOMY     CHOLECYSTECTOMY     FEMUR SURGERY Left    TUBAL LIGATION      There were no vitals filed for this visit.   Subjective Assessment - 07/30/20 1148     Subjective The patient reports she has nausea after leaving PT after DN.  She was sore at work last night and she felt weakness.  She had the "bear hug" spasms this morning and felt increased weakness.    Patient Stated Goals To be pain free    Currently in Pain? Yes    Pain Score 10-Worst pain ever    Pain Location Back    Pain Descriptors / Indicators Tightness    Pain Radiating Towards side of her R and L leg, radiating into shoulders    Pain Onset More than a month ago    Pain Frequency Intermittent    Aggravating Factors  walking, carrying buckets at hte barn    Pain Relieving Factors sitting, medicine  *felt worse after DN yesterday                [facility] PT Assessment - 07/30/20 1155       Assessment   Medical Diagnosis lumbar spondylosis    Referring Provider (PT) Rodney Langton, MD    Onset Date/Surgical Date 06/26/20    Hand Dominance Right                            [facility] Adult PT Treatment/Exercise - 07/30/20 1155       Exercises   Exercises Lumbar      Lumbar Exercises: Stretches   Other Lumbar Stretch Exercise prone on elbows with lateral rocking, supine trunk rotation, supine with knees on physioball and gentle lateral rocking      Lumbar Exercises: Sidelying   Other Sidelying Lumbar Exercises sidelying pelvic clock moving pelvic opposite to shoulder to lengthen lateral trunk musculature      Lumbar Exercises: Prone   Other Prone Lumbar Exercises prone on elbows gentle lateral rocking+      Modalities   Modalities Electrical Stimulation;Moist Heat      Moist Heat Therapy   Number Minutes Moist Heat 12 Minutes    Moist Heat Location Lumbar Spine      Electrical Stimulation   Electrical Stimulation Location low thoracic/lumbar spine    Electrical Stimulation Action interferential    Electrical Stimulation Parameters to tolerance    Electrical Stimulation Goals Pain;Tone  Manual Therapy   Manual Therapy Joint mobilization;Soft tissue mobilization    Manual therapy comments to reduce muscle guarding    Joint Mobilization CPA grade I for relaxation and mobility and to decrease muscle guarding and tone    Soft tissue mobilization STM paraspinals                         PT Long Term Goals - 07/22/20 0810       PT LONG TERM GOAL #1   Title The patient will be indep with HEP.    Time 6    Period Weeks    Status On-going      PT LONG TERM GOAL #2   Title The patient will improve functional status score from 55% up to 71%.    Time 6    Period Weeks    Status On-going      PT LONG TERM GOAL #3   Title The patient will report reduction in muscle spasms at night by 50%.    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    Time 6    Period Weeks    Status On-going      PT LONG TERM GOAL #5   Title The patient will report no pain at rest.    Time 6    Period Weeks    Status On-going                   Plan - 07/30/20 1304     Clinical Impression Statement The patient sent a message via EPIC.  She had increased pain after yesterday's session and woke with muscle spasms and continued pain.  PT worked patient in today to work on reducing pain and working through tightness to avoid further muscle guarding.  With review of yesterday's note, pain may be more attributed to increasing load on lateral trunk musculature through deeper stretching (in side sitting) and attempting side planks, which increased pain.  Pain was reduced with today's session.  Recommended gentle movement, and return to prior exercises as able to tolerate.    PT Treatment/Interventions ADLs/Self Care Home Management;Patient/family education;Therapeutic exercise;Therapeutic activities;Manual techniques;Dry needling;Taping;Aquatic Therapy;Electrical Stimulation;Cryotherapy;Moist Heat;Traction;Gait training    PT Next Visit Plan establish aquatic HEP routine.  DN/STM for Rt hip/LB, progress LE and core strength, work on ankle and hip flexor strength, body mechanics for work    PT Home Exercise Plan Access Code: [email or web address]    Consulted and Agree with Plan of Care Patient             Patient will benefit from skilled therapeutic intervention in order to improve the following deficits and impairments:     Visit Diagnosis: Acute right-sided low back pain with sciatica, sciatica laterality unspecified  Muscle weakness (generalized)  Posture abnormality     Problem List Patient Active Problem List   Diagnosis Date Noted   Lumbar spondylosis 06/26/2020   Positive ANA (antinuclear antibody) 10/26/2019   Osteoarthritis of both hands 10/26/2019   History of atrial fibrillation 02/09/2019   Encounter to establish  care 02/09/2019   Encounter for preventive care 02/09/2019   Generalized anxiety disorder 11/14/2008    Constantin Hillery, PT 07/30/2020, 1:08  PM  [facility] [address] [place], [place], [address] Phone: [phone]   Fax:  [phone]  Name: Patricia Conner MRN: [id number] Date of Birth: 26-Oct-1960

## 2020-08-02 ENCOUNTER — Encounter: Payer: Federal, State, Local not specified - PPO | Admitting: Rehabilitative and Restorative Service Providers"

## 2020-08-05 ENCOUNTER — Other Ambulatory Visit: Payer: Self-pay

## 2020-08-05 ENCOUNTER — Ambulatory Visit (INDEPENDENT_AMBULATORY_CARE_PROVIDER_SITE_OTHER): Payer: Federal, State, Local not specified - PPO | Admitting: Physical Therapy

## 2020-08-05 DIAGNOSIS — M6281 Muscle weakness (generalized): Secondary | ICD-10-CM | POA: Diagnosis not present

## 2020-08-05 DIAGNOSIS — M5440 Lumbago with sciatica, unspecified side: Secondary | ICD-10-CM

## 2020-08-05 DIAGNOSIS — R293 Abnormal posture: Secondary | ICD-10-CM

## 2020-08-05 NOTE — Therapy (Signed)
[place], [place], [address] Phone: [phone]   Fax:  [phone]  Physical Therapy Treatment  Patient Details  Name: Patricia Conner MRN: [id number] Date of Birth: Jul 02, 1960 Referring Provider (PT): Aundria Mems, MD   Encounter Date: 08/05/2020   PT End of Session - 08/05/20 0804     Visit Number 10    Number of Visits 12    Date for PT Re-Evaluation 08/12/20    Authorization Type BCBS federal    PT Start Time 0800    PT Stop Time 0840    PT Time Calculation (min) 40 min    Activity Tolerance Patient tolerated treatment well    Behavior During Therapy [facility] for tasks assessed/performed             Past Medical History:  Diagnosis Date   A-fib [facility])    Anxiety     Past Surgical History:  Procedure Laterality Date   ABDOMINAL HYSTERECTOMY     CHOLECYSTECTOMY     FEMUR SURGERY Left    TUBAL LIGATION      There were no vitals filed for this visit.   Subjective Assessment - 08/05/20 0804     Subjective Pt reports that the pain she had last week has resolved, "It was just the weirdest thing".   She reports that she continues to feel a "bear hug" sensation every morning shortly after waking; resolves with movement after a while.    Currently in Pain? Yes    Pain Score 2     Pain Location Back    Pain Orientation Right    Aggravating Factors  carrying buckets    Pain Relieving Factors sitting.                [facility] PT Assessment - 08/05/20 0001       Assessment   Medical Diagnosis lumbar spondylosis    Referring Provider (PT) Aundria Mems, MD    Onset Date/Surgical Date 06/26/20    Hand Dominance Right      Observation/Other Assessments   Focus on Therapeutic Outcomes (FOTO)  functional score 63%.      Strength   Right Hip Flexion 5/5    Right Hip Extension 4+/5    Right Hip ABduction 5/5    Left Hip Flexion 5/5    Left Hip Extension 4+/5    Left  Hip ABduction 5/5              [facility] Adult PT Treatment/Exercise - 08/05/20 0001       Lumbar Exercises: Stretches   Lower Trunk Rotation 3 reps;10 seconds    Prone on Elbows Stretch 1 rep;10 seconds    Other Lumbar Stretch Exercise QL, side stretch x 10 sec x 2 reps each side.  noodle perpendicular to spine at T8, with arms in T for stretch.    Other Lumbar Stretch Exercise L stretch x 15 sec x 2     Lumbar Exercises: Aerobic   Tread Mill 1.44mh, 5 min for warm up.      Lumbar Exercises: Machines for Strengthening   Other Lumbar Machine Exercise lat pull down with 2 plates x 12 reps with cues for scap retraction/depression      Lumbar Exercises: Standing   Row Both;15 reps;Theraband    Theraband Level (Row) Level 4 (Blue)    Shoulder Extension Strengthening;Both;10 reps    Theraband Level (Shoulder Extension) Level 3 (Green)   tactile cues for form.  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 2 laps  of 80 ft, with 2 squats to switch hands on KB.    Other Standing Lumbar Exercises SLS each leg x 15 sec, repeated with horiz head turns.  SLS forward leans to touch chair x 10 each side                    PT Education - 08/05/20 0850     Education Details added row to HEP.  (verbally added machines for gym- lat pull down, leg press, and row)    Person(s) Educated Patient    Methods Explanation;Demonstration;Verbal cues;Tactile cues    Comprehension Returned demonstration;Verbalized understanding                 PT Long Term Goals - 08/05/20 3013       PT LONG TERM GOAL #1   Title The patient will be indep with HEP.    Time 6    Period Weeks    Status On-going      PT LONG TERM GOAL #2   Title The patient will improve functional status score from 55% up to 71%.    Baseline 63% - 08/05/20    Time 6    Period Weeks    Status On-going      PT LONG TERM GOAL #3    Title The patient will report reduction in muscle spasms at night by 50%.    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    Time 6    Period Weeks    Status Achieved      PT LONG TERM GOAL #5   Title The patient will report no pain at rest.    Time 6    Period Weeks    Status Achieved                   Plan - 08/05/20 0815     Clinical Impression Statement Patricia Conner with squats and lifting is improved; she's reporting less pain with tasks at barn. Hip strength improved from initial visit.  She reported reduction of LBP at end of session. Discussed adding row, lat pull down, and leg press to her land routine when she visits the [employer].  Pt no longer has pain at rest; has met LTG#5.    Rehab Potential Good    PT Frequency 2x / week    PT Duration 6 weeks    PT Treatment/Interventions ADLs/Self Care Home Management;Patient/family education;Therapeutic exercise;Therapeutic activities;Manual techniques;Dry needling;Taping;Aquatic Therapy;Electrical Stimulation;Cryotherapy;Moist Heat;Traction;Gait training    PT Next Visit Plan Next land appt; PT to assess and create new goals. It will be end of POC. Pt intereted in continuation of PT, possibly with decreased frequency.    PT Home Exercise Plan Access Code: [email or web address]    Consulted and Agree with Plan of Care Patient             Patient will benefit from skilled therapeutic intervention in order to improve the following deficits and impairments:  Hypomobility, Pain, Impaired flexibility, Postural dysfunction, Decreased range of motion, Decreased strength, Decreased activity tolerance  Visit Diagnosis:  Acute right-sided low back pain with sciatica, sciatica laterality unspecified  Muscle weakness (generalized)  Posture  abnormality     Problem List Patient Active Problem List   Diagnosis Date Noted   Lumbar spondylosis 06/26/2020   Positive ANA (antinuclear antibody) 10/26/2019   Osteoarthritis of both hands 10/26/2019   History of atrial fibrillation 02/09/2019   Encounter to establish care 02/09/2019   Encounter for preventive care 02/09/2019   Generalized anxiety disorder 11/14/2008   Kerin Perna, PTA 08/05/20 8:55 AM  [place], [place], [address] Phone: [phone]   Fax:  [phone]  Name: Patricia Conner MRN: [id number] Date of Birth: 06/17/1960

## 2020-08-07 ENCOUNTER — Other Ambulatory Visit: Payer: Self-pay

## 2020-08-07 ENCOUNTER — Ambulatory Visit: Payer: Federal, State, Local not specified - PPO | Admitting: Sports Medicine

## 2020-08-07 DIAGNOSIS — R768 Other specified abnormal immunological findings in serum: Secondary | ICD-10-CM | POA: Diagnosis not present

## 2020-08-07 DIAGNOSIS — M47816 Spondylosis without myelopathy or radiculopathy, lumbar region: Secondary | ICD-10-CM

## 2020-08-07 DIAGNOSIS — R7689 Other specified abnormal immunological findings in serum: Secondary | ICD-10-CM

## 2020-08-07 NOTE — Progress Notes (Signed)
    Procedures performed today:    None.  Independent interpretation of notes and tests performed by another provider:   None.  Brief History, Exam, Impression, and Recommendations:    Facet arthritis of lumbar region This is a very pleasant 60 year old female, she had chronic right-sided low back pain, MRI showed predominantly a right L5-S1 facet joint degenerative changes, we proceeded with a right L5-S1 facet injection and that provided good relief. At least 50% per her report. She understands that in the future we could certainly try medial branch blocks and RFA if recurrence or worsening of pain or if she decides she is unable to tolerate the current degree of pain.  Positive ANA (antinuclear antibody) In addition to the above she has noted episodes where she hurts all over, has hyperalgesia and allodynia on her legs, shoulders, hips, back. She did have rheumatoid work-up to some degree, her ANA titer was 1: 80, she had an elevated CRP. It looks like confirmatory testing was negative. Her PCP did a rheumatology referral but this was not followed through with. I think she probably fall somewhere within the gray area of normal in autoimmune disease, but I also certainly think that fibromyalgia is at play, we discussed Lyrica and gabapentin, she can let me know she would like to try 1 of these medications and I be happy to call it in. Due to her minimally elevated ANA titer and an elevated CRP as well as polyarthralgia I would simply like rheumatology to weigh in as well for second opinion.    ___________________________________________ Ihor Austin. Benjamin Stain, M.D., ABFM., CAQSM. Primary Care and Sports Medicine [facility]  Adjunct Instructor of Family Medicine  [employer]

## 2020-08-07 NOTE — Assessment & Plan Note (Signed)
This is a very pleasant 60 year old female, she had chronic right-sided low back pain, MRI showed predominantly a right L5-S1 facet joint degenerative changes, we proceeded with a right L5-S1 facet injection and that provided good relief. At least 50% per her report. She understands that in the future we could certainly try medial branch blocks and RFA if recurrence or worsening of pain or if she decides she is unable to tolerate the current degree of pain.

## 2020-08-07 NOTE — Assessment & Plan Note (Signed)
In addition to the above she has noted episodes where she hurts all over, has hyperalgesia and allodynia on her legs, shoulders, hips, back. She did have rheumatoid work-up to some degree, her ANA titer was 1: 80, she had an elevated CRP. It looks like confirmatory testing was negative. Her PCP did a rheumatology referral but this was not followed through with. I think she probably fall somewhere within the gray area of normal in autoimmune disease, but I also certainly think that fibromyalgia is at play, we discussed Lyrica and gabapentin, she can let me know she would like to try 1 of these medications and I be happy to call it in. Due to her minimally elevated ANA titer and an elevated CRP as well as polyarthralgia I would simply like rheumatology to weigh in as well for second opinion.

## 2020-08-08 DIAGNOSIS — M797 Fibromyalgia: Secondary | ICD-10-CM

## 2020-08-09 ENCOUNTER — Other Ambulatory Visit: Payer: Self-pay

## 2020-08-09 ENCOUNTER — Encounter: Payer: Self-pay | Admitting: Physical Therapy

## 2020-08-09 ENCOUNTER — Ambulatory Visit (INDEPENDENT_AMBULATORY_CARE_PROVIDER_SITE_OTHER): Payer: Federal, State, Local not specified - PPO | Admitting: Physical Therapy

## 2020-08-09 DIAGNOSIS — M6281 Muscle weakness (generalized): Secondary | ICD-10-CM

## 2020-08-09 DIAGNOSIS — M5440 Lumbago with sciatica, unspecified side: Secondary | ICD-10-CM | POA: Diagnosis not present

## 2020-08-09 DIAGNOSIS — R293 Abnormal posture: Secondary | ICD-10-CM

## 2020-08-09 DIAGNOSIS — M797 Fibromyalgia: Secondary | ICD-10-CM | POA: Insufficient documentation

## 2020-08-09 MED ORDER — PREGABALIN 50 MG PO CAPS: ORAL_CAPSULE | ORAL | 3 refills | Status: DC

## 2020-08-09 NOTE — Therapy (Signed)
[facility] [place] [address] [place], [place], [address] Phone: [phone]   Fax:  [phone]  Physical Therapy Treatment  Patient Details  Name: Patricia Conner MRN: [id number] Date of Birth: 07-Jan-1961 Referring Provider (PT): Rodney Langton, MD   Encounter Date: 08/09/2020   PT End of Session - 08/09/20 1238     Visit Number 11    Number of Visits 12    Date for PT Re-Evaluation 08/12/20    Authorization Type BCBS federal    PT Start Time 1234    PT Stop Time 1312    PT Time Calculation (min) 38 min    Activity Tolerance Patient tolerated treatment well    Behavior During Therapy [facility] for tasks assessed/performed             Past Medical History:  Diagnosis Date   A-fib (HCC)    Anxiety     Past Surgical History:  Procedure Laterality Date   ABDOMINAL HYSTERECTOMY     CHOLECYSTECTOMY     FEMUR SURGERY Left    TUBAL LIGATION      There were no vitals filed for this visit.   Subjective Assessment - 08/09/20 1235     Subjective Pt reports she had an episode of "feeling of being stung by bee" in Lt midback, around trunk and into arm.. then into Rt LE. No rash or redness.  She will start cymbalta today.   She states she will call for appt with rheumatologist.    Patient Stated Goals To be pain free    Currently in Pain? No/denies    Pain Score 0-No pain            Pt seen for aquatic therapy today.  Treatment took place in water 3.25-4 ft in depth at the [facility] pool. Temp of water was 93.  Pt entered/exited the pool via stairs independently with single rail.  Treatment:    Gait without floatation/ UE support:  forward, backward, and Braiding with light torso rotation.  SLS with opp leg kicks front, side, back x 8 reps each leg, without touching foot to ground between directions for challenge of balance.    Ai Chi postures:  Enclosing, gathering, freeing x 10 each.  High knee  marching with reciprocal arm, core activated.  Warrior 1 position holding noodle in rainbow pattern up overhead and to the water x 5 each side, then 10 back heel lifts each leg. (Challenging)  Warrior 3 position with arms on noodle and leg lifting behind (challenging). Warrior 2 - with small pulses into front leg lunge x 30 sec each leg.   High knee step overs / step back x 10 each leg. (To simulate getting on/off horse).  D1 flexion LE x 10 each side.       PT Long Term Goals - 08/05/20 3474       PT LONG TERM GOAL #1   Title The patient will be indep with HEP.    Time 6    Period Weeks    Status On-going      PT LONG TERM GOAL #2   Title The patient will improve functional status score from 55% up to 71%.    Baseline 63% - 08/05/20    Time 6    Period Weeks    Status On-going      PT LONG TERM GOAL #3   Title The patient will report reduction in muscle spasms at night by 50%.  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    Time 6    Period Weeks    Status Achieved      PT LONG TERM GOAL #5   Title The patient will report no pain at rest.    Time 6    Period Weeks    Status Achieved                   Plan - 08/09/20 1334     Clinical Impression Statement Pt tolerated all aquatic exercises well, without production of symptoms.  Added exercises to HEP for balance and strengthening; these were challenging but do-able for her.  Pt has made great progress towards goals.    Rehab Potential Good    PT Frequency 2x / week    PT Duration 6 weeks    PT Treatment/Interventions ADLs/Self Care Home Management;Patient/family education;Therapeutic exercise;Therapeutic activities;Manual techniques;Dry needling;Taping;Aquatic Therapy;Electrical Stimulation;Cryotherapy;Moist Heat;Traction;Gait  training    PT Next Visit Plan PT to assess goals and need for additional sessions vs hold;  end of POC. Pt voiced some interest in continuation of PT, with decreased frequency. Issue Ai chi moves from this visit.    PT Home Exercise Plan Access Code: [email or web address]    Consulted and Agree with Plan of Care Patient             Patient will benefit from skilled therapeutic intervention in order to improve the following deficits and impairments:  Hypomobility, Pain, Impaired flexibility, Postural dysfunction, Decreased range of motion, Decreased strength, Decreased activity tolerance  Visit Diagnosis: Acute right-sided low back pain with sciatica, sciatica laterality unspecified  Muscle weakness (generalized)  Posture abnormality     Problem List Patient Active Problem List   Diagnosis Date Noted   Fibromyalgia 08/09/2020   Facet arthritis of lumbar region 06/26/2020   Positive ANA (antinuclear antibody) 10/26/2019   Osteoarthritis of both hands 10/26/2019   History of atrial fibrillation 02/09/2019   Encounter to establish care 02/09/2019   Encounter for preventive care 02/09/2019   Generalized anxiety disorder 11/14/2008    Mayer Camel, PTA 08/09/20 1:39 PM  [facility] [place] [address] [place], [place], [address] Phone: [phone]   Fax:  [phone]  Name: Patricia Conner MRN: [id number] Date of Birth: 12-04-1960

## 2020-08-12 ENCOUNTER — Other Ambulatory Visit: Payer: Self-pay

## 2020-08-12 ENCOUNTER — Ambulatory Visit: Payer: Federal, State, Local not specified - PPO

## 2020-08-12 ENCOUNTER — Ambulatory Visit
Admission: RE | Admit: 2020-08-12 | Discharge: 2020-08-12 | Disposition: A | Discharge status: Home / Self Care | Payer: Federal, State, Local not specified - PPO | Source: Ambulatory Visit | Attending: Medical-Surgical | Admitting: Medical-Surgical

## 2020-08-12 DIAGNOSIS — R922 Inconclusive mammogram: Secondary | ICD-10-CM | POA: Diagnosis not present

## 2020-08-12 DIAGNOSIS — N644 Mastodynia: Secondary | ICD-10-CM | POA: Diagnosis not present

## 2020-08-12 DIAGNOSIS — Z87898 Personal history of other specified conditions: Secondary | ICD-10-CM

## 2020-08-12 DIAGNOSIS — Z1231 Encounter for screening mammogram for malignant neoplasm of breast: Secondary | ICD-10-CM

## 2020-08-13 ENCOUNTER — Ambulatory Visit: Payer: Federal, State, Local not specified - PPO | Admitting: Physical Therapy

## 2020-08-13 ENCOUNTER — Encounter: Payer: Self-pay | Admitting: Physical Therapy

## 2020-08-13 DIAGNOSIS — M5440 Lumbago with sciatica, unspecified side: Secondary | ICD-10-CM | POA: Diagnosis not present

## 2020-08-13 DIAGNOSIS — R293 Abnormal posture: Secondary | ICD-10-CM | POA: Diagnosis not present

## 2020-08-13 DIAGNOSIS — M6281 Muscle weakness (generalized): Secondary | ICD-10-CM | POA: Diagnosis not present

## 2020-08-13 NOTE — Therapy (Addendum)
[place], [place], [address] Phone: [phone]   Fax:  [phone]  Physical Therapy Treatment/ Discharge Summary  Patient Details  Name: Patricia Conner MRN: [id number] Date of Birth: [birth year] Referring Provider (PT): Aundria Mems, MD   Encounter Date: 08/13/2020   PT End of Session - 08/13/20 0801     Visit Number 12    Number of Visits 12    Date for PT Re-Evaluation 08/12/20    Authorization Type BCBS federal    PT Start Time 0801    PT Stop Time 0844    PT Time Calculation (min) 43 min    Activity Tolerance Patient tolerated treatment well    Behavior During Therapy [facility] for tasks assessed/performed             Past Medical History:  Diagnosis Date   A-fib ([facility])    Anxiety     Past Surgical History:  Procedure Laterality Date   ABDOMINAL HYSTERECTOMY     CHOLECYSTECTOMY     FEMUR SURGERY Left    TUBAL LIGATION      There were no vitals filed for this visit.   Subjective Assessment - 08/13/20 0801     Subjective Started Cymbalta but no effects yet. overall feels she is doing okay. Managing the pain is the main thing she is dealing with now.    Currently in Pain? No/denies                               [facility] Adult PT Treatment/Exercise - 08/13/20 0001       Lumbar Exercises: Stretches   Other Lumbar Stretch Exercise thread the needle x 5 bil      Lumbar Exercises: Aerobic   Tread Mill 1.86mh, 6 min for warm up.      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 2 laps  of 80 ft, .      Lumbar Exercises: Sidelying   Other Sidelying Lumbar Exercises modified side plank 5 reps short holds bil      Lumbar Exercises: Prone   Other Prone Lumbar Exercises --      Lumbar Exercises: Quadruped   Madcat/Old Horse Limitations 3 reps                    PT Education - 08/13/20 1340     Education Details HEP finalized; education on exercise prescription for fibromyalgia and finding the sweet spot for tolerance.                 PT Long Term Goals - 08/13/20 0804       PT LONG TERM GOAL #1   Title The patient will be indep with HEP.    Status Achieved  PT LONG TERM GOAL #2   Title The patient will improve functional status score from 55% up to 71%.    Baseline 63% - 08/05/20    Status On-going      PT LONG TERM GOAL #3   Title The patient will report reduction in muscle spasms at night by 50%.    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    Status Achieved      PT LONG TERM GOAL #5   Title The patient will report no pain at rest.    Status Achieved                   Plan - 08/13/20 0846     Clinical Impression Statement Patient has met her long term goals except for FOTO functional score which has improved since her eval. We finalized her HEP and also reviewed likely exercises she will do at the gym. Patient going on vacation in August. She would like to be placed on hold for 4 weeks.    PT Frequency 2x / week    PT Duration 6 weeks    PT Treatment/Interventions ADLs/Self Care Home Management;Patient/family education;Therapeutic exercise;Therapeutic activities;Manual techniques;Dry needling;Taping;Aquatic Therapy;Electrical Stimulation;Cryotherapy;Moist Heat;Traction;Gait  training    PT Next Visit Plan hold until 09/10/20 then d/c if pt has not returned.    PT Home Exercise Plan Access Code: [email or web address]    Consulted and Agree with Plan of Care Patient             Patient will benefit from skilled therapeutic intervention in order to improve the following deficits and impairments:  Hypomobility, Pain, Impaired flexibility, Postural dysfunction, Decreased range of motion, Decreased strength, Decreased activity tolerance  Visit Diagnosis: Acute right-sided low back pain with sciatica, sciatica laterality unspecified  Muscle weakness (generalized)  Posture abnormality     Problem List Patient Active Problem List   Diagnosis Date Noted   Fibromyalgia 08/09/2020   Facet arthritis of lumbar region 06/26/2020   Positive ANA (antinuclear antibody) 10/26/2019   Osteoarthritis of both hands 10/26/2019   History of atrial fibrillation 02/09/2019   Encounter to establish care 02/09/2019   Encounter for preventive care 02/09/2019   Generalized anxiety disorder 11/14/2008   PHYSICAL THERAPY DISCHARGE SUMMARY  Visits from Start of Care: 12  Current functional level related to goals / functional outcomes: See goals above   Remaining deficits: Patient has improved in PT, however continues with intermittent pain.  She has comprehensive HEP to continue to address.   Education / Equipment: HEP   Patient agrees to discharge. Patient goals were partially met. Patient is being discharged due to meeting the stated rehab goals.  Rudell Cobb, PT  Madelyn Flavors PT 08/13/2020, 1:49 PM  [facility] [place], [place], [address] Phone: [phone]   Fax:  [phone]  Name: Patricia Conner MRN: [id number] Date of Birth: 01-28-1960

## 2020-08-13 NOTE — Patient Instructions (Signed)
Access Code: [email or web address] URL: [email or web address] Date: 08/13/2020 Prepared by: Raynelle Fanning  Exercises Cat Cow to Child's Pose - 2 x daily - 7 x weekly - 1 sets - 10 reps Standing Quadratus Lumborum Stretch with Doorway - 2 x daily - 7 x weekly - 1 sets - 3 reps - 20-30 seconds hold Standing 'L' Stretch at [employer] - 2 x daily - 7 x weekly - 1 sets - 2 reps Seated Hip Flexor Stretch - 2 x daily - 7 x weekly - 1 sets - 2 reps - 15-20 seconds. hold Wall Quarter Squat - 2 x daily - 7 x weekly - 1 sets - 10 reps - 3 seconds hold Single Leg Stance - 2 x daily - 7 x weekly - 1 sets - 3 reps - 15 seconds hold Supine Thoracic Mobilization Towel Roll Horizontal with Arm Stretch - 2 x daily - 7 x weekly - 1 sets - 1 reps - 1-2 minutes hold Warrior I in [employer] with [employer] - 1 x daily - 2 x weekly - 2 sets - 10 reps Warrior II in [employer] with [employer] - 1 x daily - 2 x weekly - 2 sets - 10 reps Warrior III in [employer] with [employer] - 1 x daily - 2 x weekly - 1 sets - 10 reps Child's Pose with Thread the Needle - 1 x daily - 7 x weekly - 1 sets - 5 reps - 10-20 sec hold Squat with Chair Touch - 1 x daily - 3-4 x weekly - 1-3 sets - 10 reps Side Plank on Knees - 1 x daily - 3-4 x weekly - 1 sets - 5 reps - max hold hold

## 2020-08-21 ENCOUNTER — Ambulatory Visit: Payer: Federal, State, Local not specified - PPO | Admitting: Medical-Surgical

## 2020-08-21 ENCOUNTER — Encounter: Payer: Self-pay | Admitting: Medical-Surgical

## 2020-08-21 VITALS — BP 117/70 | HR 90 | Resp 20 | Ht 67.75 in | Wt 201.0 lb

## 2020-08-21 DIAGNOSIS — L989 Disorder of the skin and subcutaneous tissue, unspecified: Secondary | ICD-10-CM

## 2020-08-21 MED ORDER — CLOBETASOL PROPIONATE 0.05 % EX SOLN: 1.0000 "application " | Freq: Two times a day (BID) | CUTANEOUS | 0 refills | Status: DC

## 2020-08-21 NOTE — Progress Notes (Signed)
  HPI with pertinent ROS:   CC: Spots on scalp  HPI: Very pleasant 60 year old female presenting today with reports of several months of itchy small spots on the top of her head.  She has not been worried about them but unfortunately they started hurting recently.  She does not remember that she catching them but admits that she may have been doing that in her sleep.  She is had no change in shampoos or hair products.  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  She does not have a dermatologist.  Tried an antifungal shampoo on the areas but it was not helpful.  I reviewed the past medical history, family history, social history, surgical history, and allergies today and no changes were needed.  Please see the problem list section below in epic for further details.   Physical exam:   General: Well Developed, well nourished, and in no acute distress.  Neuro: Alert and oriented x3.  HEENT: Normocephalic, atraumatic.  Skin: Warm and dry.  2 small mildly erythematous lesions to the top of the scalp.  The anterior lesion is 1-2 mm in size with mild elevation.  The posterior lesion on the crown of the head is 2-3 mm of erythema without elevation. Cardiac: Regular rate and rhythm, no murmurs rubs or gallops, no lower extremity edema.  Respiratory: Clear to auscultation bilaterally. Not using accessory muscles, speaking in full sentences.  Impression and Recommendations:    1. Lesion of skin of scalp Unclear etiology.  Low suspicion for basal cell carcinoma or melanoma given the appearance.  Possibly an irritated nevus.  Since this is quite pruritic, we will do a short trial of clobetasol topical solution twice daily to see if her symptoms continue.  In the meantime, since she has had quite a bit of exposure to the sun during her career, we are referring to dermatology for evaluation as well as a general skin survey. - Ambulatory referral to Dermatology  Return if  symptoms worsen or fail to improve. ___________________________________________ Thayer Ohm, DNP, APRN, FNP-BC Primary Care and Sports Medicine [facility] [place]

## 2020-09-10 ENCOUNTER — Ambulatory Visit: Payer: Federal, State, Local not specified - PPO | Admitting: Family Medicine

## 2020-09-15 ENCOUNTER — Other Ambulatory Visit: Payer: Self-pay | Admitting: Medical-Surgical

## 2020-09-23 DIAGNOSIS — L578 Other skin changes due to chronic exposure to nonionizing radiation: Secondary | ICD-10-CM | POA: Diagnosis not present

## 2020-09-23 DIAGNOSIS — D225 Melanocytic nevi of trunk: Secondary | ICD-10-CM | POA: Diagnosis not present

## 2020-09-23 DIAGNOSIS — C44311 Basal cell carcinoma of skin of nose: Secondary | ICD-10-CM | POA: Diagnosis not present

## 2020-09-23 DIAGNOSIS — L219 Seborrheic dermatitis, unspecified: Secondary | ICD-10-CM | POA: Diagnosis not present

## 2020-09-30 NOTE — Progress Notes (Signed)
Office Visit Note  Patient: Patricia Conner             Date of Birth: Aug 27, 1960           MRN: [id number]             PCP: Samuel Bouche, NP Referring: Silverio Decamp,* Visit Date: 10/14/2020 Occupation: '@GUAROCC' @  Subjective:  Positive ANA.   History of Present Illness: Patricia Conner is a 60 y.o. female in consultation per request of her PCP for the evaluation of positive ANA.  According the patient she moved to [place] from [place] last year in December.  She states she started experiencing increased muscle pain and pin andneedle sensation all over her body.  She states the pain is worse in the morning and gets better as the day goes on.  She also experiences more pain during the summertime than the winter months.  She states her PCP diagnosed her with fibromyalgia syndrome about 2 months ago and prescribed Lyrica which helped her a lot with the pain management.  She also had some labs and her ANA came positive for that reason she was referred to me.  There is no history of fatigue, oral ulcers, nasal ulcers, malar rash, photosensitivity, Raynaud's phenomenon, lymphadenopathy or inflammatory arthritis.  She denies any joint pain.  There is no family history of autoimmune disease.  She is gravida 3, para 3, no history of DVTs.  Activities of Daily Living:  Patient reports morning stiffness for 5-10 minutes.   Patient Denies nocturnal pain.  Difficulty dressing/grooming: Denies Difficulty climbing stairs: Denies Difficulty getting out of chair: Denies Difficulty using hands for taps, buttons, cutlery, and/or writing: Denies  Review of Systems  Constitutional:  Negative for fatigue.  HENT:  Negative for mouth sores, mouth dryness and nose dryness.   Eyes:  Negative for pain, itching and dryness.  Respiratory:  Positive for shortness of breath. Negative for difficulty breathing.        Occasional  Cardiovascular:  Negative for chest pain and palpitations.   Gastrointestinal:  Negative for blood in stool, constipation and diarrhea.  Endocrine: Positive for increased urination.  Genitourinary:  Negative for difficulty urinating.  Musculoskeletal:  Positive for joint pain, joint pain, myalgias, morning stiffness, muscle tenderness and myalgias. Negative for joint swelling.  Skin:  Negative for color change, rash, redness and sensitivity to sunlight.  Allergic/Immunologic: Negative for susceptible to infections.  Neurological:  Positive for weakness. Negative for dizziness, numbness, headaches and memory loss.  Hematological:  Negative for bruising/bleeding tendency and swollen glands.  Psychiatric/Behavioral:  Negative for depressed mood, confusion and sleep disturbance. The patient is nervous/anxious.    PMFS History:  Patient Active Problem List   Diagnosis Date Noted   Fibromyalgia 08/09/2020   Facet arthritis of lumbar region 06/26/2020   Positive ANA (antinuclear antibody) 10/26/2019   Osteoarthritis of both hands 10/26/2019   History of atrial fibrillation 02/09/2019   Encounter to establish care 02/09/2019   Encounter for preventive care 02/09/2019   Generalized anxiety disorder 11/14/2008    Past Medical History:  Diagnosis Date   A-fib [facility])    Anxiety     Family History  Problem Relation Age of Onset   Heart attack Father    Colon cancer Father    Healthy Brother    Healthy Brother    Autoimmune disease Maternal Grandfather    Healthy Son    Healthy Son    Healthy Daughter    Past Surgical  History:  Procedure Laterality Date   ABDOMINAL HYSTERECTOMY     CHOLECYSTECTOMY     FEMUR SURGERY Left    TUBAL LIGATION     Social History   Social History Narrative   Not on file   Immunization History  Administered Date(s) Administered   Influenza,inj,Quad PF,6+ Mos 02/09/2019, 09/22/2019   Influenza,inj,quad, With Preservative 11/15/2015   Influenza-Unspecified 11/02/2017   Janssen (J&J) SARS-COV-2 Vaccination  04/22/2019   Tdap 07/12/2009, 09/22/2019   Zoster Recombinat (Shingrix) 07/23/2017, 03/29/2018     Objective: Vital Signs: BP 118/79 (BP Location: Right Arm, Patient Position: Sitting, Cuff Size: Normal)   Pulse 79   Ht 5' 7.5" (1.715 m)   Wt 201 lb 3.2 oz (91.3 kg)   BMI 31.05 kg/m    Physical Exam Vitals and nursing note reviewed.  Constitutional:      Appearance: She is well-developed.  HENT:     Head: Normocephalic and atraumatic.  Eyes:     Conjunctiva/sclera: Conjunctivae normal.  Cardiovascular:     Rate and Rhythm: Normal rate and regular rhythm.     Heart sounds: Normal heart sounds.  Pulmonary:     Effort: Pulmonary effort is normal.     Breath sounds: Normal breath sounds.  Abdominal:     General: Bowel sounds are normal.     Palpations: Abdomen is soft.  Musculoskeletal:     Cervical back: Normal range of motion.  Lymphadenopathy:     Cervical: No cervical adenopathy.  Skin:    General: Skin is warm and dry.     Capillary Refill: Capillary refill takes less than 2 seconds.  Neurological:     Mental Status: She is alert and oriented to person, place, and time.  Psychiatric:        Behavior: Behavior normal.     Musculoskeletal Exam: C-spine was in good range of motion.  She had discomfort range of motion of her lumbar spine and paravertebral lumbar region.  She also had tenderness over her SI joint and gluteal region.  Shoulder joints, elbow joints, wrist joints with good range of motion.  She had bilateral PIP and DIP thickening with no synovitis.  Hip joints and knee joints with good range of motion.  She had no tenderness over ankles or MTPs.  She generalized hyperalgesia and positive tender points.  She had tenderness over trochanteric bursa and over the medial aspect of her knees.  CDAI Exam: CDAI Score: -- Patient Global: --; Provider Global: -- Swollen: --; Tender: -- Joint Exam 10/14/2020   No joint exam has been documented for this visit    There is currently no information documented on the homunculus. Go to the Rheumatology activity and complete the homunculus joint exam.  Investigation: No additional findings.  Imaging: No results found.  Recent Labs: Lab Results  Component Value Date   WBC 6.9 09/22/2019   HGB 13.3 09/22/2019   PLT 271 09/22/2019   NA 139 09/22/2019   K 4.0 09/22/2019   CL 105 09/22/2019   CO2 28 09/22/2019   GLUCOSE 120 09/22/2019   BUN 9 09/22/2019   CREATININE 0.74 09/22/2019   BILITOT 0.3 07/03/2019   AST 29 07/03/2019   ALT 33 (H) 07/03/2019   PROT 7.1 07/03/2019   CALCIUM 9.7 09/22/2019   GFRAA 105 09/22/2019    Speciality Comments: No specialty comments available.  Procedures:  No procedures performed Allergies: Patient has no known allergies.   Assessment / Plan:     Visit Diagnoses:  Positive ANA (antinuclear antibody) - 09/22/19: ANA 1:80NN, ESR 19, RF<14, CRP 7.8, CK 98. 10/26/19: Ro-, La-, centromere ab-, SM ab-, dsDNA-, complements WNL.  She had low titer positive ANA, ENA was negative, sed rate was normal and complements were normal.  There is no history of fatigue, oral ulcers, nasal ulcers, malar rash, photosensitivity, Raynaud's phenomenon, inflammatory arthritis or lymphadenopathy.  There is no family history of autoimmune disease.  There is no history of miscarriages or DVTs.  Patient states her symptoms have improved a lot since she has been taking Lyrica.  I do not think any further work-up is necessary at this time as she is asymptomatic.  I advised her to contact me in case she develops any new symptoms.  Primary osteoarthritis of both hands-she has osteoarthritis in her bilateral hands.  Joint protection muscle strengthening was discussed.  Facet arthritis of lumbar region-she continues to have some lower back pain.  I gave her a handout on back exercises.  Fibromyalgia-she gives history of generalized pain, hyperalgesia and neurologist.  Her symptoms have improved  since she has been on Lyrica.  I will also refer to physical therapy.  Need for regular exercise and stretching was emphasized.  History of atrial fibrillation-she is currently not taking any treatment.  Generalized anxiety disorder-she is on Cymbalta, which should help with the anxiety and also generalized pain.  Orders: No orders of the defined types were placed in this encounter.  No orders of the defined types were placed in this encounter.    Follow-Up Instructions: Return if symptoms worsen or fail to improve, for +ANA.   Bo Merino, MD  Note - This record has been created using Editor, commissioning.  Chart creation errors have been sought, but may not always  have been located. Such creation errors do not reflect on  the standard of medical care.

## 2020-10-14 ENCOUNTER — Other Ambulatory Visit: Payer: Self-pay

## 2020-10-14 ENCOUNTER — Encounter: Payer: Self-pay | Admitting: Rheumatology

## 2020-10-14 ENCOUNTER — Ambulatory Visit: Payer: Federal, State, Local not specified - PPO | Admitting: Rheumatology

## 2020-10-14 VITALS — BP 118/79 | HR 79 | Ht 67.5 in | Wt 201.2 lb

## 2020-10-14 DIAGNOSIS — M19042 Primary osteoarthritis, left hand: Secondary | ICD-10-CM

## 2020-10-14 DIAGNOSIS — Z8679 Personal history of other diseases of the circulatory system: Secondary | ICD-10-CM

## 2020-10-14 DIAGNOSIS — R768 Other specified abnormal immunological findings in serum: Secondary | ICD-10-CM

## 2020-10-14 DIAGNOSIS — M797 Fibromyalgia: Secondary | ICD-10-CM | POA: Diagnosis not present

## 2020-10-14 DIAGNOSIS — M47816 Spondylosis without myelopathy or radiculopathy, lumbar region: Secondary | ICD-10-CM

## 2020-10-14 DIAGNOSIS — M19041 Primary osteoarthritis, right hand: Secondary | ICD-10-CM

## 2020-10-14 DIAGNOSIS — F411 Generalized anxiety disorder: Secondary | ICD-10-CM

## 2020-10-14 NOTE — Patient Instructions (Signed)
Back Exercises The following exercises strengthen the muscles that help to support the trunk (torso) and back. They also help to keep the lower back flexible. Doing these exercises can help to prevent or lessen existing low back pain. If you have back pain or discomfort, try doing these exercises 2-3 times each day or as told by your health care provider. As your pain improves, do them once each day, but increase the number of times that you repeat the steps for each exercise (do more repetitions). To prevent the recurrence of back pain, continue to do these exercises once each day or as told by your health care provider. Do exercises exactly as told by your health care provider and adjust them as directed. It is normal to feel mild stretching, pulling, tightness, or discomfort as you do these exercises, but you should stop right away if you feel sudden pain or your pain gets worse. Exercises Single knee to chest Repeat these steps 3-5 times for each leg: Lie on your back on a firm bed or the floor with your legs extended. Bring one knee to your chest. Your other leg should stay extended and in contact with the floor. Hold your knee in place by grabbing your knee or thigh with both hands and hold. Pull on your knee until you feel a gentle stretch in your lower back or buttocks. Hold the stretch for 10-30 seconds. Slowly release and straighten your leg. Pelvic tilt Repeat these steps 5-10 times: Lie on your back on a firm bed or the floor with your legs extended. Bend your knees so they are pointing toward the ceiling and your feet are flat on the floor. Tighten your lower abdominal muscles to press your lower back against the floor. This motion will tilt your pelvis so your tailbone points up toward the ceiling instead of pointing to your feet or the floor. With gentle tension and even breathing, hold this position for 5-10 seconds. Cat-cow Repeat these steps until your lower back becomes more  flexible: Get into a hands-and-knees position on a firm bed or the floor. Keep your hands under your shoulders, and keep your knees under your hips. You may place padding under your knees for comfort. Let your head hang down toward your chest. Contract your abdominal muscles and point your tailbone toward the floor so your lower back becomes rounded like the back of a cat. Hold this position for 5 seconds. Slowly lift your head, let your abdominal muscles relax, and point your tailbone up toward the ceiling so your back forms a sagging arch like the back of a cow. Hold this position for 5 seconds.  Press-ups Repeat these steps 5-10 times: Lie on your abdomen (face-down) on a firm bed or the floor. Place your palms near your head, about shoulder-width apart. Keeping your back as relaxed as possible and keeping your hips on the floor, slowly straighten your arms to raise the top half of your body and lift your shoulders. Do not use your back muscles to raise your upper torso. You may adjust the placement of your hands to make yourself more comfortable. Hold this position for 5 seconds while you keep your back relaxed. Slowly return to lying flat on the floor.  Bridges Repeat these steps 10 times: Lie on your back on a firm bed or the floor. Bend your knees so they are pointing toward the ceiling and your feet are flat on the floor. Your arms should be flat at your   sides, next to your body. Tighten your buttocks muscles and lift your buttocks off the floor until your waist is at almost the same height as your knees. You should feel the muscles working in your buttocks and the back of your thighs. If you do not feel these muscles, slide your feet 1-2 inches (2.5-5 cm) farther away from your buttocks. Hold this position for 3-5 seconds. Slowly lower your hips to the starting position, and allow your buttocks muscles to relax completely. If this exercise is too easy, try doing it with your arms  crossed over your chest. Abdominal crunches Repeat these steps 5-10 times: Lie on your back on a firm bed or the floor with your legs extended. Bend your knees so they are pointing toward the ceiling and your feet are flat on the floor. Cross your arms over your chest. Tip your chin slightly toward your chest without bending your neck. Tighten your abdominal muscles and slowly raise your torso high enough to lift your shoulder blades a tiny bit off the floor. Avoid raising your torso higher than that because it can put too much stress on your lower back and does not help to strengthen your abdominal muscles. Slowly return to your starting position. Back lifts Repeat these steps 5-10 times: Lie on your abdomen (face-down) with your arms at your sides, and rest your forehead on the floor. Tighten the muscles in your legs and your buttocks. Slowly lift your chest off the floor while you keep your hips pressed to the floor. Keep the back of your head in line with the curve in your back. Your eyes should be looking at the floor. Hold this position for 3-5 seconds. Slowly return to your starting position. Contact a health care provider if: Your back pain or discomfort gets much worse when you do an exercise. Your worsening back pain or discomfort does not lessen within 2 hours after you exercise. If you have any of these problems, stop doing these exercises right away. Do not do them again unless your health care provider says that you can. Get help right away if: You develop sudden, severe back pain. If this happens, stop doing the exercises right away. Do not do them again unless your health care provider says that you can. This information is not intended to replace advice given to you by your health care provider. Make sure you discuss any questions you have with your health care provider. Document Revised: 03/13/2020 Document Reviewed: 03/13/2020 [employer] Patient Education  2022 [employer] Hand Exercises Hand exercises can be helpful for almost anyone. These exercises can strengthen the hands, improve flexibility and movement, and increase blood flow to the hands. These results can make work and daily tasks easier. Hand exercises can be especially helpful for people who have joint pain from arthritis or have nerve damage from overuse (carpal tunnel syndrome). These exercises can also help people who have injured a hand. Exercises Most of these hand exercises are gentle stretching and motion exercises. It is usually safe to do them often throughout the day. Warming up your hands before exercise may help to reduce stiffness. You can do this with gentle massage or by placing your hands in warm water for 10-15 minutes. It is normal to feel some stretching, pulling, tightness, or mild discomfort as you begin new exercises. This will gradually improve. Stop an exercise right away if you feel sudden, severe pain or your pain gets worse. Ask your health care provider which   exercises are best for you. Knuckle bend or "claw" fist  Stand or sit with your arm, hand, and all five fingers pointed straight up. Make sure to keep your wrist straight during the exercise. Gently bend your fingers down toward your palm until the tips of your fingers are touching the top of your palm. Keep your big knuckle straight and just bend the small knuckles in your fingers. Hold this position for __________ seconds. Straighten (extend) your fingers back to the starting position. Repeat this exercise 5-10 times with each hand. Full finger fist  Stand or sit with your arm, hand, and all five fingers pointed straight up. Make sure to keep your wrist straight during the exercise. Gently bend your fingers into your palm until the tips of your fingers are touching the middle of your palm. Hold this position for __________ seconds. Extend your fingers back to the starting position, stretching every joint  fully. Repeat this exercise 5-10 times with each hand. Straight fist Stand or sit with your arm, hand, and all five fingers pointed straight up. Make sure to keep your wrist straight during the exercise. Gently bend your fingers at the big knuckle, where your fingers meet your hand, and the middle knuckle. Keep the knuckle at the tips of your fingers straight and try to touch the bottom of your palm. Hold this position for __________ seconds. Extend your fingers back to the starting position, stretching every joint fully. Repeat this exercise 5-10 times with each hand. Tabletop  Stand or sit with your arm, hand, and all five fingers pointed straight up. Make sure to keep your wrist straight during the exercise. Gently bend your fingers at the big knuckle, where your fingers meet your hand, as far down as you can while keeping the small knuckles in your fingers straight. Think of forming a tabletop with your fingers. Hold this position for __________ seconds. Extend your fingers back to the starting position, stretching every joint fully. Repeat this exercise 5-10 times with each hand. Finger spread  Place your hand flat on a table with your palm facing down. Make sure your wrist stays straight as you do this exercise. Spread your fingers and thumb apart from each other as far as you can until you feel a gentle stretch. Hold this position for __________ seconds. Bring your fingers and thumb tight together again. Hold this position for __________ seconds. Repeat this exercise 5-10 times with each hand. Making circles  Stand or sit with your arm, hand, and all five fingers pointed straight up. Make sure to keep your wrist straight during the exercise. Make a circle by touching the tip of your thumb to the tip of your index finger. Hold for __________ seconds. Then open your hand wide. Repeat this motion with your thumb and each finger on your hand. Repeat this exercise 5-10 times with each  hand. Thumb motion  Sit with your forearm resting on a table and your wrist straight. Your thumb should be facing up toward the ceiling. Keep your fingers relaxed as you move your thumb. Lift your thumb up as high as you can toward the ceiling. Hold for __________ seconds. Bend your thumb across your palm as far as you can, reaching the tip of your thumb for the small finger (pinkie) side of your palm. Hold for __________ seconds. Repeat this exercise 5-10 times with each hand. Grip strengthening  Hold a stress ball or other soft ball in the middle of your hand. Slowly increase   the pressure, squeezing the ball as much as you can without causing pain. Think of bringing the tips of your fingers into the middle of your palm. All of your finger joints should bend when doing this exercise. Hold your squeeze for __________ seconds, then relax. Repeat this exercise 5-10 times with each hand. Contact a health care provider if: Your hand pain or discomfort gets much worse when you do an exercise. Your hand pain or discomfort does not improve within 2 hours after you exercise. If you have any of these problems, stop doing these exercises right away. Do not do them again unless your health care provider says that you can. Get help right away if: You develop sudden, severe hand pain or swelling. If this happens, stop doing these exercises right away. Do not do them again unless your health care provider says that you can. This information is not intended to replace advice given to you by your health care provider. Make sure you discuss any questions you have with your health care provider. Document Revised: 04/18/2020 Document Reviewed: 04/18/2020 Elsevier Patient Education  2022 [employer]  

## 2020-10-14 NOTE — Addendum Note (Signed)
Addended by: Ellen Henri on: 10/14/2020 09:04 AM   Modules accepted: Orders

## 2020-10-28 DIAGNOSIS — C44319 Basal cell carcinoma of skin of other parts of face: Secondary | ICD-10-CM | POA: Diagnosis not present

## 2020-11-08 ENCOUNTER — Ambulatory Visit: Payer: Federal, State, Local not specified - PPO | Admitting: Rheumatology

## 2020-12-01 ENCOUNTER — Other Ambulatory Visit: Payer: Self-pay | Admitting: Medical-Surgical

## 2020-12-01 DIAGNOSIS — F411 Generalized anxiety disorder: Secondary | ICD-10-CM

## 2021-01-08 ENCOUNTER — Other Ambulatory Visit: Payer: Self-pay | Admitting: Sports Medicine

## 2021-01-08 DIAGNOSIS — M797 Fibromyalgia: Secondary | ICD-10-CM

## 2021-01-13 ENCOUNTER — Other Ambulatory Visit: Payer: Self-pay | Admitting: Medical-Surgical

## 2021-01-25 IMAGING — DX DG CHEST 2V
2 series · 2 of 2 positions shown · non-contrast
Comparison: None.

CLINICAL DATA: Cough and anterior chest tightness for 2 days.
Fever, emesis, and shortness of breath.

EXAM:
CHEST - 2 VIEW

[chest pa]
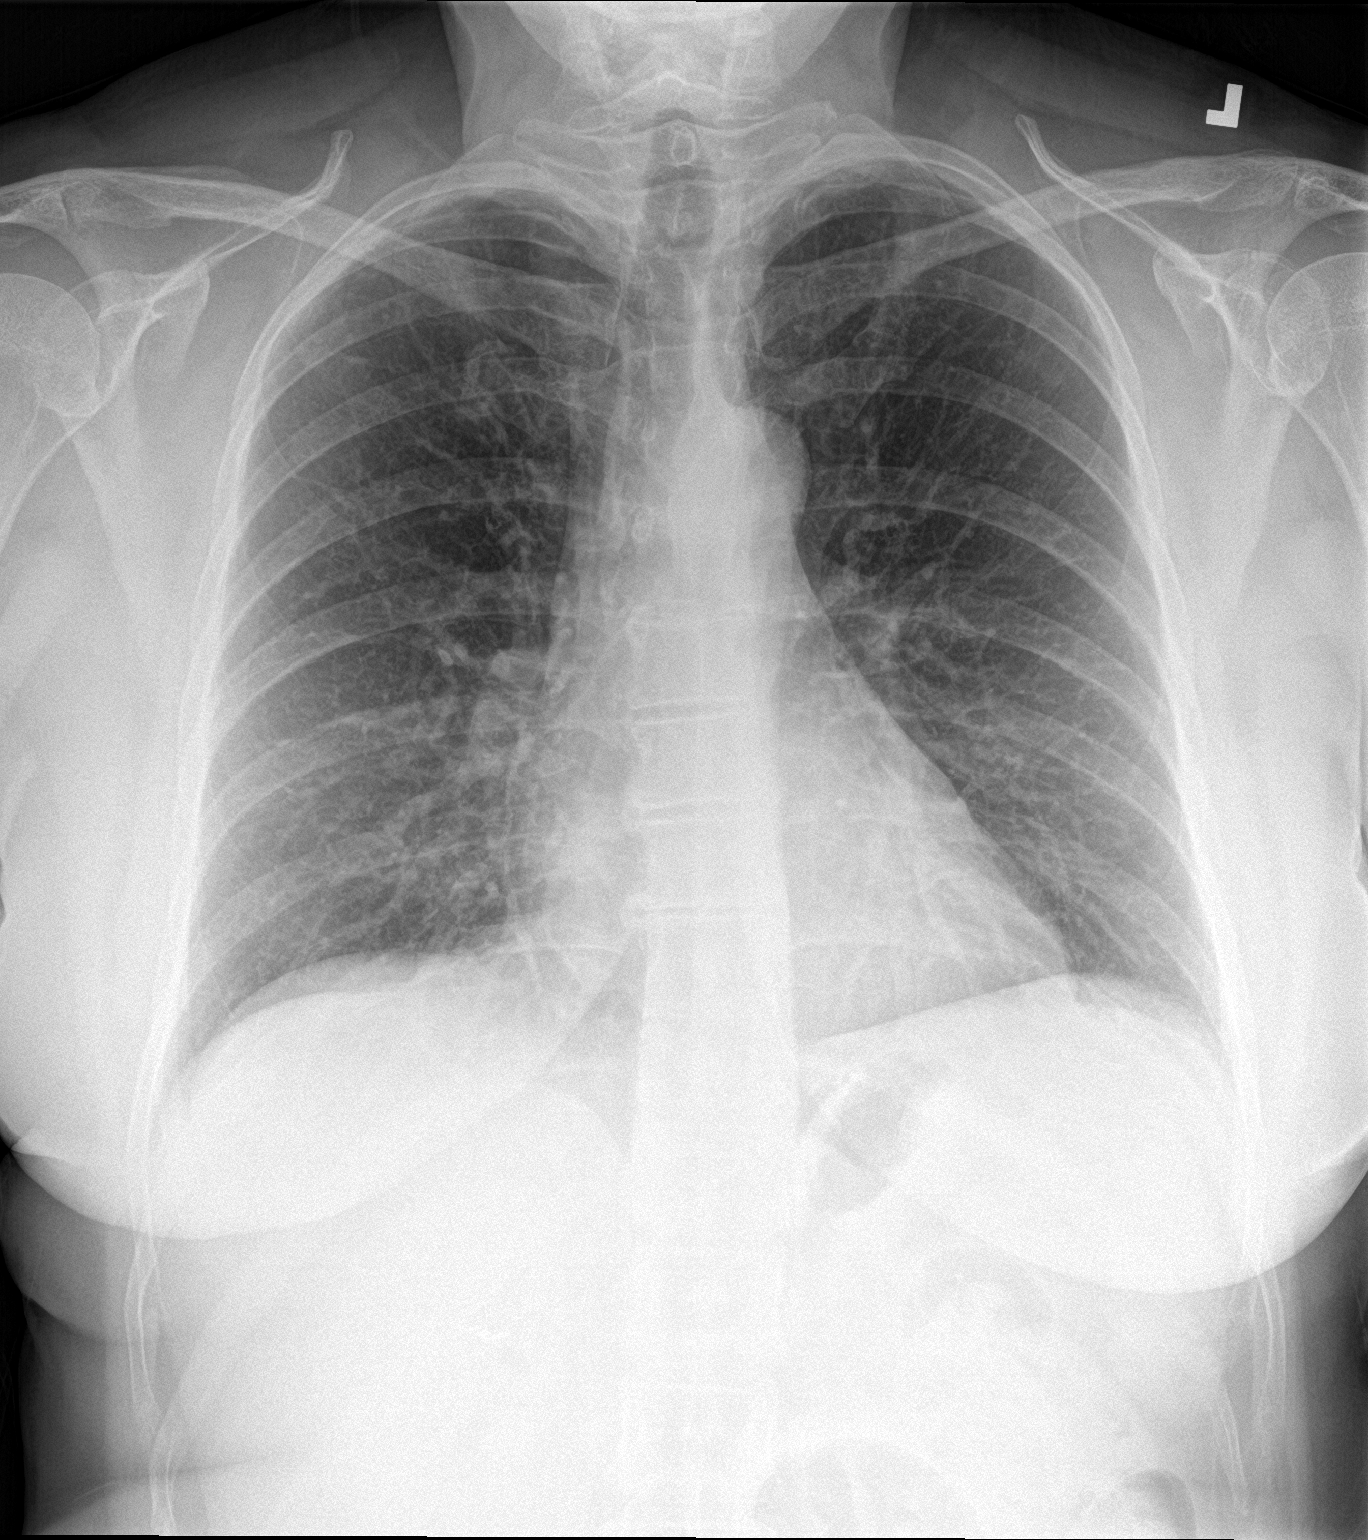

[chest lat]
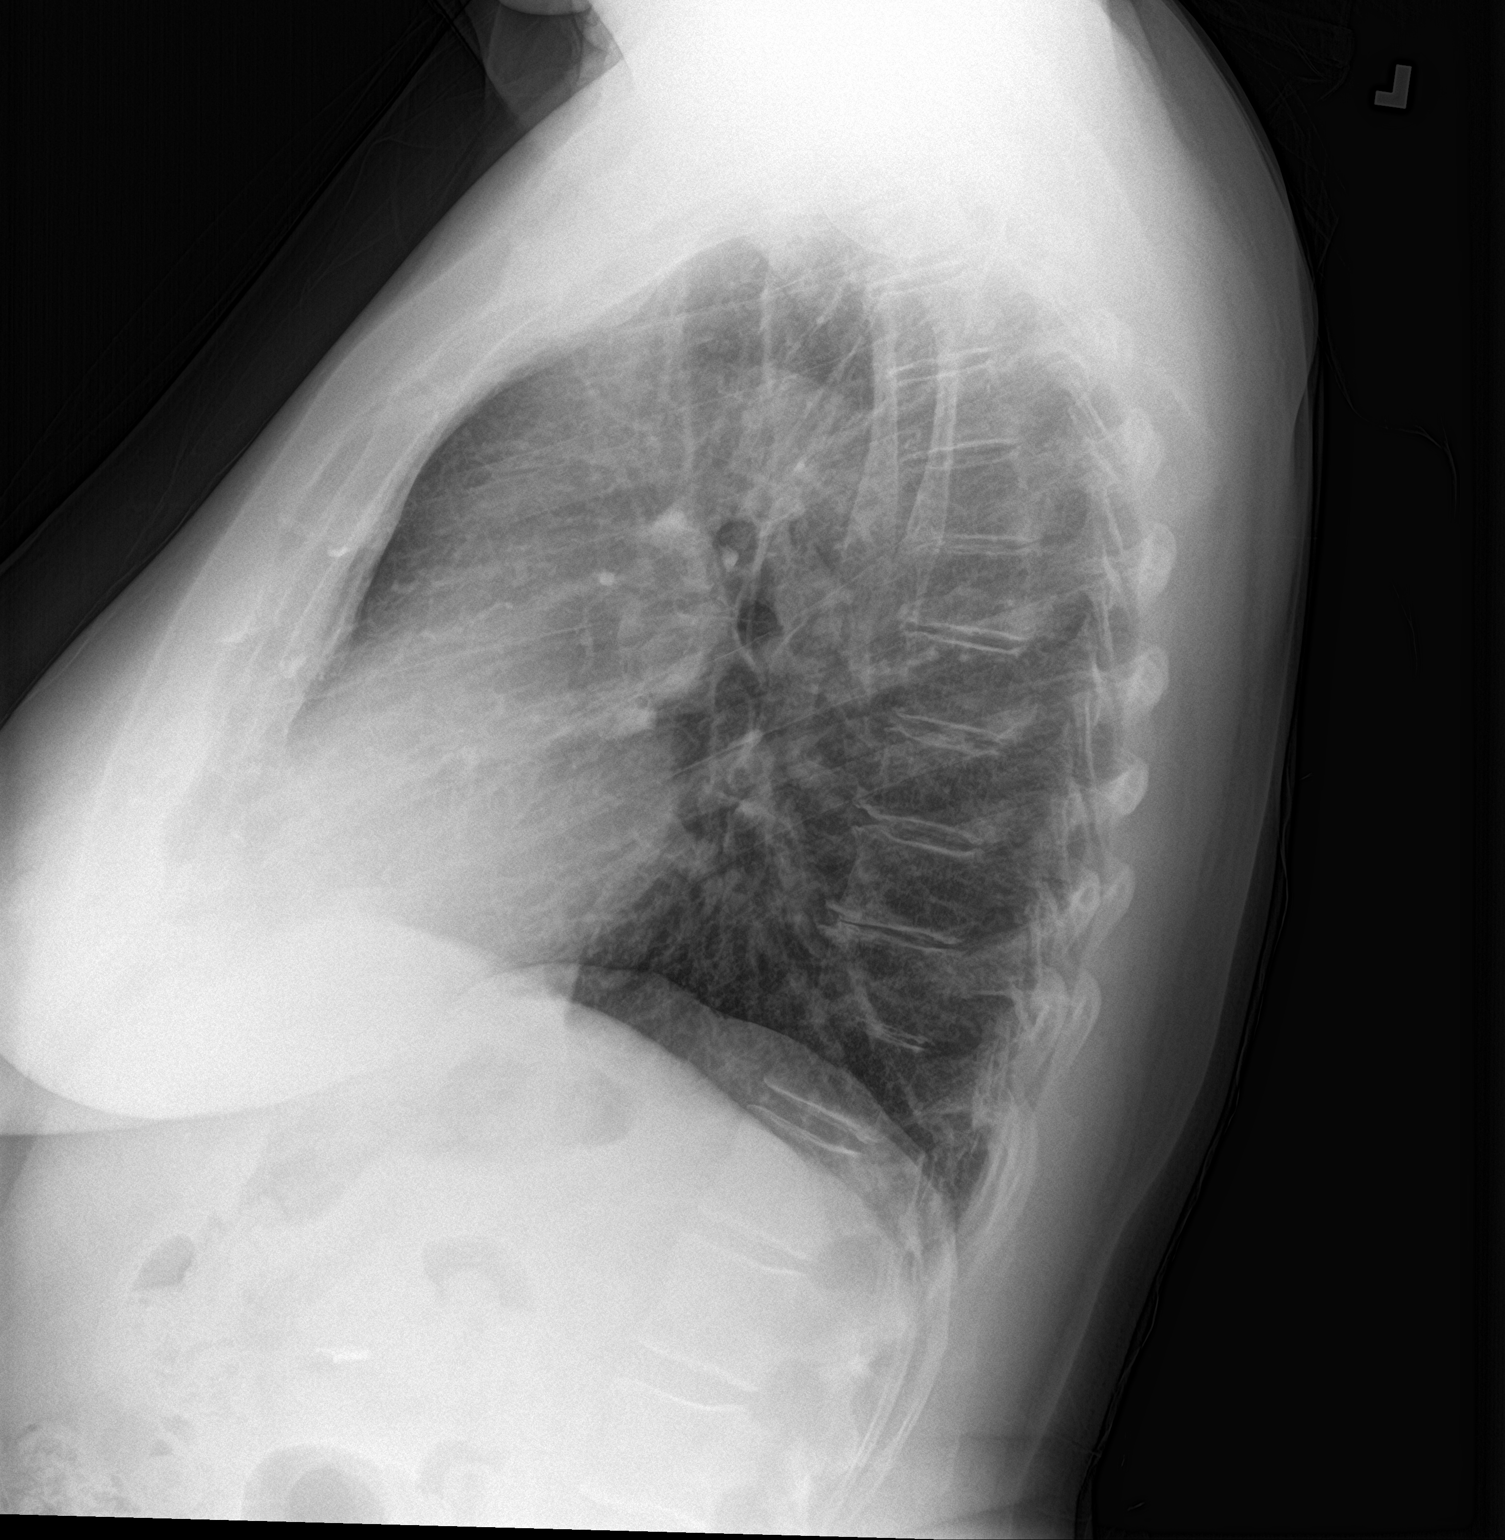

[2 of 2 positions shown; findings below may reference images not displayed]

FINDINGS: The heart size and mediastinal contours are within normal limits.
Both lungs are clear. The visualized skeletal structures are
unremarkable.
IMPRESSION: No active cardiopulmonary disease.

## 2021-03-20 ENCOUNTER — Other Ambulatory Visit: Payer: Self-pay | Admitting: Medical-Surgical

## 2021-03-20 DIAGNOSIS — M797 Fibromyalgia: Secondary | ICD-10-CM

## 2021-03-24 ENCOUNTER — Other Ambulatory Visit: Payer: Self-pay | Admitting: Sports Medicine

## 2021-03-24 ENCOUNTER — Encounter: Payer: Self-pay | Admitting: Medical-Surgical

## 2021-03-24 DIAGNOSIS — M797 Fibromyalgia: Secondary | ICD-10-CM

## 2021-03-25 ENCOUNTER — Encounter: Payer: Self-pay | Admitting: Medical-Surgical

## 2021-03-25 MED ORDER — PREGABALIN 50 MG PO CAPS: 50.0000 mg | ORAL_CAPSULE | Freq: Three times a day (TID) | ORAL | 0 refills | Status: DC

## 2021-04-09 ENCOUNTER — Encounter: Payer: Self-pay | Admitting: Emergency Medicine

## 2021-04-09 ENCOUNTER — Emergency Department
Admission: EM | Admit: 2021-04-09 | Discharge: 2021-04-09 | Disposition: A | Discharge status: Home / Self Care | Payer: Federal, State, Local not specified - PPO | Source: Home / Self Care

## 2021-04-09 DIAGNOSIS — R079 Chest pain, unspecified: Secondary | ICD-10-CM

## 2021-04-09 NOTE — Discharge Instructions (Addendum)
Advised patient EKG revealed normal sinus rhythm with left axis deviation.  Advised patient if symptoms worsen and/or unresolved to go to nearest ED for further evaluation to possibly include serological testing with serial troponin's and imaging. ?

## 2021-04-09 NOTE — ED Provider Notes (Signed)
?[facility] ? ? ? ?CSN: [id number] ?Arrival date & time: 04/09/21  1442 ? ? ?  ? ?History   ?Chief Complaint ?Chief Complaint  ?Patient presents with  ? Chest Pain  ? ? ?HPI ?Patricia Conner is a 61 y.o. female.  ? ?HPI the 61 year old female presents with chest pain for days.  PMH significant for atrial fibrillation, fibromyalgia and GAD. ? ?Past Medical History:  ?Diagnosis Date  ? A-fib (HCC)   ? Anxiety   ? ? ?Patient Active Problem List  ? Diagnosis Date Noted  ? Fibromyalgia 08/09/2020  ? Facet arthritis of lumbar region 06/26/2020  ? Positive ANA (antinuclear antibody) 10/26/2019  ? Osteoarthritis of both hands 10/26/2019  ? History of atrial fibrillation 02/09/2019  ? Encounter to establish care 02/09/2019  ? Encounter for preventive care 02/09/2019  ? Generalized anxiety disorder 11/14/2008  ? ? ?Past Surgical History:  ?Procedure Laterality Date  ? ABDOMINAL HYSTERECTOMY    ? CHOLECYSTECTOMY    ? FEMUR SURGERY Left   ? TUBAL LIGATION    ? ? ?OB History   ? ? Gravida  ?3  ? Para  ?3  ? Term  ?3  ? Preterm  ?   ? AB  ?   ? Living  ?3  ?  ? ? SAB  ?   ? IAB  ?   ? Ectopic  ?   ? Multiple  ?   ? Live Births  ?   ?   ?  ?  ? ? ? ?Home Medications   ? ?Prior to Admission medications   ?Medication Sig Start Date End Date Taking? Authorizing Provider  ?clobetasol (TEMOVATE) 0.05 % external solution APPLY 1 APPLICATION TOPICALLY 2 (TWO) TIMES DAILY. USE FOR 7-14 DAYS. 01/14/21   Christen Butter, NP  ?DULoxetine (CYMBALTA) 60 MG capsule TAKE 1 CAPSULE BY MOUTH EVERY DAY 12/02/20   Christen Butter, NP  ?meloxicam (MOBIC) 15 MG tablet TAKE 1 TABLET (15 MG TOTAL) BY MOUTH DAILY. ?Patient not taking: Reported on 04/09/2021 09/17/20   Christen Butter, NP  ?pregabalin (LYRICA) 50 MG capsule Take 1 capsule (50 mg total) by mouth 3 (three) times daily. NEEDS APPT FOR FURTHER REFILLS 03/25/21   Christen Butter, NP  ? ? ?Family History ?Family History  ?Problem Relation Age of Onset  ? Heart attack Father   ? Colon cancer Father   ?  Healthy Brother   ? Healthy Brother   ? Autoimmune disease Maternal Grandfather   ? Healthy Son   ? Healthy Son   ? Healthy Daughter   ? ? ?Social History ?Social History  ? ?Tobacco Use  ? Smoking status: Never  ? Smokeless tobacco: Never  ?Vaping Use  ? Vaping Use: Never used  ?Substance Use Topics  ? Alcohol use: Yes  ?  Comment: Occasionally  ? Drug use: Never  ? ? ? ?Allergies   ?Patient has no known allergies. ? ? ?Review of Systems ?Review of Systems  ?Cardiovascular:  Positive for chest pain.  ?All other systems reviewed and are negative. ? ? ?Physical Exam ?Triage Vital Signs ?ED Triage Vitals  ?Enc Vitals Group  ?   BP   ?   Pulse   ?   Resp   ?   Temp   ?   Temp src   ?   SpO2   ?   Weight   ?   Height   ?   Head Circumference   ?  Peak Flow   ?   Pain Score   ?   Pain Loc   ?   Pain Edu?   ?   Excl. in GC?   ? ?No data found. ? ?Updated Vital Signs ?BP 112/77 (BP Location: Left Arm)   Pulse 82   Temp 98.2 ?F (36.8 ?C) (Oral)   Resp 16   Ht 5' 7.5" (1.715 m)   Wt 200 lb (90.7 kg)   SpO2 97%   BMI 30.86 kg/m?  ? ?Physical Exam ?Vitals and nursing note reviewed.  ?Constitutional:   ?   General: She is not in acute distress. ?   Appearance: Normal appearance. She is well-developed. She is obese. She is not ill-appearing, toxic-appearing or diaphoretic.  ?HENT:  ?   Head: Normocephalic and atraumatic.  ?   Mouth/Throat:  ?   Mouth: Mucous membranes are moist.  ?   Pharynx: Oropharynx is clear.  ?Eyes:  ?   Extraocular Movements: Extraocular movements intact.  ?   Pupils: Pupils are equal, round, and reactive to light.  ?Neck:  ?   Comments: No JVD, no bruit ?Cardiovascular:  ?   Rate and Rhythm: Normal rate and regular rhythm.  ?   Pulses:     ?     Radial pulses are 2+ on the right side and 2+ on the left side.  ?     Posterior tibial pulses are 2+ on the right side and 2+ on the left side.  ?   Heart sounds: Normal heart sounds. No murmur heard. ?  No S3 or S4 sounds.  ?Pulmonary:  ?   Effort:  Pulmonary effort is normal. No respiratory distress.  ?   Breath sounds: Normal breath sounds. No stridor. No wheezing, rhonchi or rales.  ?Chest:  ?   Chest wall: No mass, deformity, tenderness or edema.  ?Musculoskeletal:     ?   General: Normal range of motion.  ?   Cervical back: Normal range of motion and neck supple.  ?Skin: ?   General: Skin is warm and dry.  ?Neurological:  ?   General: No focal deficit present.  ?   Mental Status: She is alert and oriented to person, place, and time. Mental status is at baseline.  ?   Cranial Nerves: No cranial nerve deficit.  ?   Sensory: No sensory deficit.  ?   Motor: No weakness.  ? ? ? ?UC Treatments / Results  ?Labs ?(all labs ordered are listed, but only abnormal results are displayed) ?Labs Reviewed - No data to display ? ?EKG ? ? ?Radiology ?No results found. ? ?Procedures ?Procedures (including critical care time) ? ?Medications Ordered in UC ?Medications - No data to display ? ?Initial Impression / Assessment and Plan / UC Course  ?I have reviewed the triage vital signs and the nursing notes. ? ?Pertinent labs & imaging results that were available during my care of the patient were reviewed by me and considered in my medical decision making (see chart for details). ? ?  ? ?MDM: 1.  Chest pain, unspecified type-EKG revealed NSR with left axis deviation. Advised patient EKG revealed normal sinus rhythm with left axis deviation.  Advised patient if symptoms worsen and/or unresolved to nearest ED for further evaluation to possibly include serological testing with serial troponin's and imaging.  Patient discharged home, hemodynamically stable. ?Final Clinical Impressions(s) / UC Diagnoses  ? ?Final diagnoses:  ?Chest pain, unspecified type  ? ? ? ?Discharge  Instructions   ? ?  ?Advised patient EKG revealed normal sinus rhythm with left axis deviation.  Advised patient if symptoms worsen and/or unresolved to go to nearest ED for further evaluation to possibly include  serological testing with serial troponin's and imaging. ? ? ? ? ?ED Prescriptions   ?None ?  ? ?PDMP not reviewed this encounter. ?  ?Trevor IhaRagan, Smita Lesh, FNP ?04/09/21 1543 ? ?

## 2021-04-09 NOTE — ED Triage Notes (Signed)
SOB  & sternal chest pain x 1 week ?Wakes her up up at night - Tylenol helps  ?History of palpations - dx with afib - on medication  ?

## 2021-05-13 ENCOUNTER — Encounter: Payer: Federal, State, Local not specified - PPO | Admitting: Medical-Surgical

## 2021-05-14 NOTE — Progress Notes (Signed)
Erroneous encounter. Please disregard.

## 2021-06-06 ENCOUNTER — Other Ambulatory Visit: Payer: Self-pay | Admitting: Medical-Surgical

## 2021-06-06 DIAGNOSIS — F411 Generalized anxiety disorder: Secondary | ICD-10-CM

## 2021-09-07 ENCOUNTER — Other Ambulatory Visit: Payer: Self-pay | Admitting: Medical-Surgical

## 2021-09-07 DIAGNOSIS — F411 Generalized anxiety disorder: Secondary | ICD-10-CM

## 2021-09-12 NOTE — Telephone Encounter (Signed)
Patient needs appointment for further refills.  Last office visit 08/21/2020  Last filled 06/06/2021  30 day supply sent. No refills.

## 2021-09-16 ENCOUNTER — Telehealth: Payer: Self-pay

## 2021-09-16 NOTE — Telephone Encounter (Signed)
Left pt a message that the Rx has been filled but we need to schedule an appt before the next refill.  Tvt

## 2021-09-27 ENCOUNTER — Other Ambulatory Visit: Payer: Self-pay | Admitting: Medical-Surgical

## 2021-09-27 DIAGNOSIS — F411 Generalized anxiety disorder: Secondary | ICD-10-CM

## 2021-10-13 ENCOUNTER — Encounter: Payer: Self-pay | Admitting: Medical-Surgical

## 2021-11-06 ENCOUNTER — Other Ambulatory Visit: Payer: Self-pay | Admitting: Medical-Surgical

## 2021-11-06 DIAGNOSIS — F411 Generalized anxiety disorder: Secondary | ICD-10-CM

## 2021-11-06 NOTE — Telephone Encounter (Signed)
Patient needs an appointment for further refills.  Last filled 09/12/2021  Last office visit 08/21/2020  Sent 30 day supply. No refills.

## 2021-11-06 NOTE — Telephone Encounter (Signed)
Lvm for patient to call back to schedule an appointment for further med refill. - tvt

## 2021-11-26 ENCOUNTER — Other Ambulatory Visit: Payer: Self-pay | Admitting: Medical-Surgical

## 2021-11-26 DIAGNOSIS — F411 Generalized anxiety disorder: Secondary | ICD-10-CM

## 2021-11-26 NOTE — Telephone Encounter (Signed)
Patient needs an appointment for further refills on DULoxetine (CYMBALTA) 60 MG capsule   Last office visit 08/21/2020  Last filled 11/06/2021  Sent 15 days supply to the pharmacy. No Refills.

## 2021-11-27 NOTE — Telephone Encounter (Signed)
LVM for patient to call back to get further refills and that the Rx was filled with 15 tablets.  Further refills on DULoxetine (CYMBALTA) 60 MG capsule. tvt

## 2022-08-26 ENCOUNTER — Encounter (INDEPENDENT_AMBULATORY_CARE_PROVIDER_SITE_OTHER): Payer: Self-pay

## 2022-09-07 ENCOUNTER — Telehealth: Payer: Federal, State, Local not specified - PPO | Admitting: Nurse Practitioner

## 2022-09-07 DIAGNOSIS — U071 COVID-19: Secondary | ICD-10-CM

## 2022-09-07 NOTE — Progress Notes (Signed)
Virtual Visit Consent   Patricia Conner, you are scheduled for a virtual visit with a [facility] provider today. Just as with appointments in the office, your consent must be obtained to participate. Your consent will be active for this visit and any virtual visit you may have with one of our providers in the next 365 days. If you have a MyChart account, a copy of this consent can be sent to you electronically.  As this is a virtual visit, video technology does not allow for your provider to perform a traditional examination. This may limit your provider's ability to fully assess your condition. If your provider identifies any concerns that need to be evaluated in person or the need to arrange testing (such as labs, EKG, etc.), we will make arrangements to do so. Although advances in technology are sophisticated, we cannot ensure that it will always work on either your end or our end. If the connection with a video visit is poor, the visit may have to be switched to a telephone visit. With either a video or telephone visit, we are not always able to ensure that we have a secure connection.  By engaging in this virtual visit, you consent to the provision of healthcare and authorize for your insurance to be billed (if applicable) for the services provided during this visit. Depending on your insurance coverage, you may receive a charge related to this service.  I need to obtain your verbal consent now. Are you willing to proceed with your visit today? Patricia Conner has provided verbal consent on 09/07/2022 for a virtual visit (video or telephone). Viviano Simas, FNP  Date: 09/07/2022 8:44 AM  Virtual Visit via Video Note   I, Viviano Simas, connected with  Patricia Conner  ([id number], 28-Dec-1960) on 09/07/22 at  8:45 AM EDT by a video-enabled telemedicine application and verified that I am speaking with the correct person using two identifiers.  Location: Patient: Virtual Visit Location Patient:  Home Provider: Virtual Visit Location Provider: Home Office   I discussed the limitations of evaluation and management by telemedicine and the availability of in person appointments. The patient expressed understanding and agreed to proceed.    History of Present Illness: Patricia Conner is a 62 y.o. who identifies as a female who was assigned female at birth, and is being seen today after testing positive for COVID-19.  Symptom onset was last night  Symptoms include: body aches, fever, sore throat, headache   She has had COVID in the past has not been treated with anti-virals in the past  She has been vaccinated for COVID x1 without boosters   Denies HTN/DM or asthma/COPD    Problems:  Patient Active Problem List   Diagnosis Date Noted   Fibromyalgia 08/09/2020   Facet arthritis of lumbar region 06/26/2020   Positive ANA (antinuclear antibody) 10/26/2019   Osteoarthritis of both hands 10/26/2019   History of atrial fibrillation 02/09/2019   Encounter to establish care 02/09/2019   Encounter for preventive care 02/09/2019   Generalized anxiety disorder 11/14/2008    Allergies: No Known Allergies Medications: None   Observations/Objective: Patient is well-developed, well-nourished in no acute distress.  Resting comfortably  at home.  Head is normocephalic, atraumatic.  No labored breathing.  Speech is clear and coherent with logical content.  Patient is alert and oriented at baseline.    Assessment and Plan:  1. COVID-19  Discussed management options     No labs on file from  the past year, overall low risk factors  If she wanted to consider Paxlovid she would need GFR   She agrees to continue over the counter management of symptoms  Assuring hydration and caloric intake  Immune support as well with Vitamin C, extra rest  Work note provided  Discussed CDC guidelines for isolation    Follow Up Instructions: I discussed the assessment and treatment plan with  the patient. The patient was provided an opportunity to ask questions and all were answered. The patient agreed with the plan and demonstrated an understanding of the instructions.  A copy of instructions were sent to the patient via MyChart unless otherwise noted below.    The patient was advised to call back or seek an in-person evaluation if the symptoms worsen or if the condition fails to improve as anticipated.  Time:  I spent 12 minutes with the patient via telehealth technology discussing the above problems/concerns.    Viviano Simas, FNP

## 2023-01-29 DIAGNOSIS — R110 Nausea: Secondary | ICD-10-CM | POA: Diagnosis not present

## 2023-01-29 DIAGNOSIS — N200 Calculus of kidney: Secondary | ICD-10-CM | POA: Diagnosis not present

## 2023-01-29 DIAGNOSIS — Z90710 Acquired absence of both cervix and uterus: Secondary | ICD-10-CM | POA: Diagnosis not present

## 2023-01-29 DIAGNOSIS — K760 Fatty (change of) liver, not elsewhere classified: Secondary | ICD-10-CM | POA: Diagnosis not present

## 2023-01-29 DIAGNOSIS — Z9049 Acquired absence of other specified parts of digestive tract: Secondary | ICD-10-CM | POA: Diagnosis not present

## 2023-01-29 DIAGNOSIS — R1011 Right upper quadrant pain: Secondary | ICD-10-CM | POA: Diagnosis not present

## 2023-01-29 DIAGNOSIS — R10811 Right upper quadrant abdominal tenderness: Secondary | ICD-10-CM | POA: Diagnosis not present

## 2023-01-29 DIAGNOSIS — Z862 Personal history of diseases of the blood and blood-forming organs and certain disorders involving the immune mechanism: Secondary | ICD-10-CM | POA: Diagnosis not present

## 2023-01-29 DIAGNOSIS — M25511 Pain in right shoulder: Secondary | ICD-10-CM | POA: Diagnosis not present

## 2023-01-29 DIAGNOSIS — R079 Chest pain, unspecified: Secondary | ICD-10-CM | POA: Diagnosis not present

## 2023-09-14 ENCOUNTER — Encounter: Payer: Self-pay | Admitting: Sports Medicine
